# Patient Record
Sex: Male | Born: 1990
Health system: Southern US, Community
[De-identification: ages and names within clinical notes are randomized; demographics above are authoritative.]

## PROBLEM LIST (undated history)

## (undated) DIAGNOSIS — F419 Anxiety disorder, unspecified: Secondary | ICD-10-CM

## (undated) DIAGNOSIS — F32A Depression, unspecified: Secondary | ICD-10-CM

## (undated) DIAGNOSIS — Z87820 Personal history of traumatic brain injury: Secondary | ICD-10-CM

## (undated) HISTORY — PX: NO PAST SURGERIES: SHX2092

## (undated) HISTORY — DX: Personal history of traumatic brain injury: Z87.820

## (undated) HISTORY — DX: Depression, unspecified: F32.A

## (undated) HISTORY — DX: Anxiety disorder, unspecified: F41.9

## (undated) HISTORY — PX: DENTAL SURGERY: SHX609

---
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
## 2015-12-18 ENCOUNTER — Other Ambulatory Visit: Payer: Self-pay | Admitting: Orthopedic Surgery

## 2015-12-18 DIAGNOSIS — M25562 Pain in left knee: Secondary | ICD-10-CM

## 2016-01-11 ENCOUNTER — Encounter (INDEPENDENT_AMBULATORY_CARE_PROVIDER_SITE_OTHER): Payer: Self-pay | Admitting: *Deleted

## 2016-04-17 ENCOUNTER — Encounter (HOSPITAL_COMMUNITY): Payer: Self-pay | Admitting: Emergency Medicine

## 2016-04-17 ENCOUNTER — Ambulatory Visit (HOSPITAL_COMMUNITY)
Admission: EM | Admit: 2016-04-17 | Discharge: 2016-04-17 | Disposition: A | Discharge status: Home / Self Care | Payer: 59 | Attending: Emergency Medicine | Admitting: Emergency Medicine

## 2016-04-17 DIAGNOSIS — B9789 Other viral agents as the cause of diseases classified elsewhere: Secondary | ICD-10-CM

## 2016-04-17 DIAGNOSIS — J069 Acute upper respiratory infection, unspecified: Secondary | ICD-10-CM | POA: Diagnosis not present

## 2016-04-17 MED ORDER — BENZONATATE 100 MG PO CAPS: 100.0000 mg | ORAL_CAPSULE | Freq: Three times a day (TID) | ORAL | 0 refills | Status: DC

## 2016-04-17 MED ORDER — IPRATROPIUM BROMIDE 0.06 % NA SOLN: 2.0000 | Freq: Four times a day (QID) | NASAL | 12 refills | Status: DC

## 2016-04-17 NOTE — ED Triage Notes (Signed)
The patient [facility] with a complaint of a cough and sore throat with body aches that started 3 days ago.

## 2016-04-17 NOTE — Discharge Instructions (Signed)
You most likely have a viral URI, I advise rest, plenty of fluids and management of symptoms with over the counter medicines. For symptoms you may take Tylenol as needed every 4-6 hours for body aches or fever, not to exceed 4,000 mg a day, Take mucinex or mucinex DM ever 12 hours with a full glass of water, you may use an inhaled steroid such as Flonase, 2 sprays each nostril once a day for congestion, or an antihistamine such as Claritin or Zyrtec once a day. In addition to this I have prescribed Tessalon, take 1 tablet 3 times a day as needed for cough and Ipratropium nasal spray, 2 sprays each nostril 4 times a day for congestion.  Should your symptoms worsen or fail to resolve, follow up with your primary care provider or return to clinic.

## 2016-04-17 NOTE — ED Provider Notes (Signed)
CSN: [id number]     Arrival date & time 04/17/16  1632 History   First MD Initiated Contact with Patient 04/17/16 1752     Chief Complaint  Patient presents with  . Cough   (Consider location/radiation/quality/duration/timing/severity/associated sxs/prior Treatment) 26 year old male with three day history of cough, congestion, fever, and chills. Cough is worse at night, productive with clear to white sputum. No change in appetite or fluid intake, he has not taken any OTC medicines.    Cough  Associated symptoms: chills, fever and rhinorrhea   Associated symptoms: no ear pain, no myalgias, no shortness of breath, no sore throat and no wheezing     History reviewed. No pertinent past medical history. History reviewed. No pertinent surgical history. History reviewed. No pertinent family history. Social History  Substance Use Topics  . Smoking status: Never Smoker  . Smokeless tobacco: Never Used  . Alcohol use Yes     Comment: weekly    Review of Systems  Constitutional: Positive for chills, fatigue and fever. Negative for appetite change.  HENT: Positive for congestion and rhinorrhea. Negative for ear discharge, ear pain, sinus pain, sinus pressure and sore throat.   Eyes: Negative.   Respiratory: Positive for cough. Negative for choking, shortness of breath and wheezing.   Cardiovascular: Negative.   Gastrointestinal: Negative for abdominal pain, diarrhea, nausea and vomiting.  Musculoskeletal: Negative.  Negative for arthralgias, back pain, myalgias, neck pain and neck stiffness.  Skin: Negative.   Neurological: Negative for dizziness, syncope, weakness and light-headedness.  All other systems reviewed and are negative.   Allergies  Patient has no known allergies.  Home Medications   Prior to Admission medications   Medication Sig Start Date End Date Taking? Authorizing Provider  benzonatate (TESSALON) 100 MG capsule Take 1 capsule (100 mg total) by mouth every 8  (eight) hours. 04/17/16   Dorena BodoLawrence Meridian Scherger, NP  ipratropium (ATROVENT) 0.06 % nasal spray Place 2 sprays into both nostrils 4 (four) times daily. 04/17/16   Dorena BodoLawrence Dartanyon Frankowski, NP   Meds Ordered and Administered this Visit  Medications - No data to display  BP 117/76 (BP Location: Right Arm)   Pulse 87   Temp 98.6 F (37 C) (Oral)   Resp 16   SpO2 97%  No data found.   Physical Exam  Constitutional: He is oriented to person, place, and time. He appears well-developed and well-nourished. No distress.  HENT:  Right Ear: Tympanic membrane and external ear normal.  Left Ear: Tympanic membrane and external ear normal.  Nose: Nose normal. Right sinus exhibits no maxillary sinus tenderness and no frontal sinus tenderness. Left sinus exhibits no maxillary sinus tenderness and no frontal sinus tenderness.  Mouth/Throat: Uvula is midline and oropharynx is clear and moist. No oropharyngeal exudate.  Eyes: Pupils are equal, round, and reactive to light.  Neck: Normal range of motion. Neck supple. No JVD present.  Cardiovascular: Normal rate and regular rhythm.   Pulmonary/Chest: Effort normal and breath sounds normal. No respiratory distress. He has no wheezes.  Abdominal: Soft. Bowel sounds are normal.  Lymphadenopathy:    He has no cervical adenopathy.  Neurological: He is alert and oriented to person, place, and time.  Skin: Skin is warm and dry. Capillary refill takes less than 2 seconds. No rash noted. He is not diaphoretic. No erythema.  Psychiatric: He has a normal mood and affect.  Nursing note and vitals reviewed.   Urgent Care Course   Clinical Course  Procedures (including critical care time)  Labs Review Labs Reviewed - No data to display  Imaging Review No results found.   Visual Acuity Review  Right Eye Distance:   Left Eye Distance:   Bilateral Distance:    Right Eye Near:   Left Eye Near:    Bilateral Near:         MDM   1. Viral URI with cough    You most likely have a viral URI, I advise rest, plenty of fluids and management of symptoms with over the counter medicines. For symptoms you may take Tylenol as needed every 4-6 hours for body aches or fever, not to exceed 4,000 mg a day, Take mucinex or mucinex DM ever 12 hours with a full glass of water, you may use an inhaled steroid such as Flonase, 2 sprays each nostril once a day for congestion, or an antihistamine such as Claritin or Zyrtec once a day. In addition to this I have prescribed Tessalon, take 1 tablet 3 times a day as needed for cough and Ipratropium nasal spray, 2 sprays each nostril 4 times a day for congestion.  Should your symptoms worsen or fail to resolve, follow up with your primary care provider or return to clinic.      Dorena Bodo, NP 04/17/16 1805

## 2018-05-10 ENCOUNTER — Encounter (INDEPENDENT_AMBULATORY_CARE_PROVIDER_SITE_OTHER): Payer: Self-pay

## 2018-05-10 ENCOUNTER — Encounter: Payer: Self-pay | Admitting: Family Medicine

## 2018-05-10 ENCOUNTER — Ambulatory Visit: Payer: BLUE CROSS/BLUE SHIELD | Admitting: Family Medicine

## 2018-05-10 VITALS — BP 134/62 | HR 82 | Temp 98.9°F | Ht 66.5 in | Wt 120.0 lb

## 2018-05-10 DIAGNOSIS — F410 Panic disorder [episodic paroxysmal anxiety] without agoraphobia: Secondary | ICD-10-CM

## 2018-05-10 DIAGNOSIS — Z23 Encounter for immunization: Secondary | ICD-10-CM | POA: Diagnosis not present

## 2018-05-10 MED ORDER — HYDROXYZINE PAMOATE 50 MG PO CAPS: 50.0000 mg | ORAL_CAPSULE | Freq: Three times a day (TID) | ORAL | 0 refills | Status: DC | PRN

## 2018-05-10 NOTE — Progress Notes (Signed)
Subjective:     Brent Faulkner is a 28 y.o. male presenting for Establish Care (previous PCP Dr Clarene Duke at [facility]) and Panic Attack (3 weeks ago had 2 episodes of panic attacks. Symptoms were sweaty, heart racing, ears ringing, feeling like world is crashing down or so. lasted 20 minutes. Have been fine since then.)     Anxiety  Presents for initial visit. Onset was 1 to 4 weeks ago. The problem has been waxing and waning. Symptoms include chest pain, irritability, nervous/anxious behavior, palpitations and panic. Patient reports no decreased concentration, depressed mood, hyperventilation, insomnia or shortness of breath. Symptoms occur rarely. The most recent episode lasted 20 minutes. The severity of symptoms is severe. The symptoms are aggravated by family issues (grandfather diagnosed with stage 4 lung cancer). The quality of sleep is good.   (Concussion) Past treatments include nothing.   No skipped  Exercises regularly w/o cp or sob, or palpitations  Review of Systems  Constitutional: Positive for diaphoresis and irritability.  HENT:       Ears ringing  Respiratory: Negative for shortness of breath.   Cardiovascular: Positive for chest pain and palpitations.  Psychiatric/Behavioral: Negative for decreased concentration. The patient is nervous/anxious. The patient does not have insomnia.      Social History   Tobacco Use  Smoking Status Former Smoker  . Packs/day: 0.25  . Years: 4.00  . Pack years: 1.00  . Types: Cigarettes  . Last attempt to quit: 04/05/2015  . Years since quitting: 3.0  Smokeless Tobacco Never Used        Objective:    BP Readings from Last 3 Encounters:  05/10/18 134/62  04/17/16 117/76   Wt Readings from Last 3 Encounters:  05/10/18 120 lb (54.4 kg)    BP 134/62   Pulse 82   Temp 98.9 F (37.2 C)   Ht 5' 6.5" (1.689 m)   Wt 120 lb (54.4 kg)   SpO2 98%   BMI 19.08 kg/m    Physical Exam Constitutional:    Appearance: Normal appearance. He is not ill-appearing or diaphoretic.  HENT:     Right Ear: External ear normal.     Left Ear: External ear normal.     Nose: Nose normal.  Eyes:     General: No scleral icterus.    Extraocular Movements: Extraocular movements intact.     Conjunctiva/sclera: Conjunctivae normal.  Neck:     Musculoskeletal: Neck supple.  Cardiovascular:     Rate and Rhythm: Normal rate and regular rhythm.     Heart sounds: No murmur.  Pulmonary:     Effort: Pulmonary effort is normal. No respiratory distress.     Breath sounds: No wheezing or rales.  Skin:    General: Skin is warm and dry.  Neurological:     Mental Status: He is alert. Mental status is at baseline.  Psychiatric:        Mood and Affect: Mood normal.    Depression screen PHQ 2/9 05/10/2018  Decreased Interest 1  Down, Depressed, Hopeless 1  PHQ - 2 Score 2  Altered sleeping 0  Tired, decreased energy 1  Change in appetite 0  Feeling bad or failure about yourself  1  Trouble concentrating 0  Moving slowly or fidgety/restless 0  Suicidal thoughts 0  PHQ-9 Score 4  Difficult doing work/chores Somewhat difficult   GAD 7 : Generalized Anxiety Score 05/10/2018  Nervous, Anxious, on Edge 1  Control/stop worrying 0  Worry too  much - different things 1  Trouble relaxing 0  Restless 0  Easily annoyed or irritable 0  Afraid - awful might happen 0  Total GAD 7 Score 2  Anxiety Difficulty Somewhat difficult           Assessment & Plan:   Problem List Items Addressed This Visit    None    Visit Diagnoses    Panic attack    -  Primary   Relevant Medications   hydrOXYzine (VISTARIL) 50 MG capsule     Symptoms most consistent with panic attack as not having similar symptoms with exercise routine. Though PHQ and GAD relatively low. Does not seem very chronic at this time. Watch and wait and trial of hydroxyzine if symptoms return.   Return sooner if symptoms persisting   Return in about 1  year (around 05/11/2019).  [name], MD

## 2018-05-10 NOTE — Patient Instructions (Addendum)
#  Panic Attacks - try the hydroxyzine if you get symptoms again - this is to take as needed - if you are getting symptoms more often, return to clinic  Otherwise come back in 1 year or sooner for visits as needed

## 2018-11-13 ENCOUNTER — Telehealth: Payer: Self-pay

## 2018-11-13 NOTE — Telephone Encounter (Signed)
I will see him then

## 2018-11-13 NOTE — Telephone Encounter (Signed)
For 2 wks pt has achy lt shoulder pain that radiates down upper lt arm and into lt upper chest. Pain originates in lt shoulder. No jaw or neck pain. No SOB,H/A, dizziness no N&V and no weakness in extremities.when pt gets out of car Lt shoulder hurts worse; when pt moves lt shoulder hurts worse. Pain level is 5 - 6 when at its most intense pain. Pt has no covid symptoms, no travel and no known exposure to + covid.pt does not seem in distress. Pt requested appt on 11/16/18 in afternoon since pt is off work then. Offered pt sooner appt but pt declined. ED precautions given and pt voiced understanding. Pt has in office appt on 11/16/18 at 2 PM with Dr Glori Bickers.

## 2018-11-16 ENCOUNTER — Encounter: Payer: Self-pay | Admitting: Family Medicine

## 2018-11-16 ENCOUNTER — Other Ambulatory Visit: Payer: Self-pay

## 2018-11-16 ENCOUNTER — Ambulatory Visit (INDEPENDENT_AMBULATORY_CARE_PROVIDER_SITE_OTHER): Payer: BC Managed Care – PPO | Admitting: Family Medicine

## 2018-11-16 DIAGNOSIS — M25512 Pain in left shoulder: Secondary | ICD-10-CM

## 2018-11-16 MED ORDER — MELOXICAM 15 MG PO TABS: 15.0000 mg | ORAL_TABLET | Freq: Every day | ORAL | 1 refills | Status: DC

## 2018-11-16 NOTE — Assessment & Plan Note (Signed)
Suspect tendonitis (possible biceps) from overuse  Likely from repetitive overhead pushing with LUE 20 or more times per day at work (also some push ups)  Disc use of ice Will change arms for above activity /avoid when able  meloxicam 15 mg daily with food for 30 days/then prn (watch for GI side eff)  Given handout re: passive rom shoulder exercises as well to do daily  If not starting to improve in 2 wk -will plan appt with sport med

## 2018-11-16 NOTE — Patient Instructions (Signed)
Switch arms for reped lifting/overhead activity   Take meloxicam with a meal daily for about a month  Then use as needed  Avoid positions that hurt   Use ice for 10 minutes whenever you can   If not starting to improve in 2 weeks call so we can schedule a sport med visit with Dr Lorelei Pont

## 2018-11-16 NOTE — Progress Notes (Signed)
Subjective:    Patient ID: Brent Faulkner, male    DOB: October 01, 1990, 28 y.o.   MRN: [id number]  HPI Here for L shoulder pain /also arm   2 weeks -dull pain on top of shoulder  Radiates to back of shoulder/scapula  Goes down arm occ - will tingle under arm sometimes   Hurts to close car door He drives with L arm on top and palm down  Also when pushing up a vertical door at work  (over 20-30 times per day)  Is R handed   He has tried ice /helped for a little while  Tried ibuprofen -it does help briefly (2 pills)   Patient Active Problem List   Diagnosis Date Noted  . Shoulder pain, left 11/16/2018   Past Medical History:  Diagnosis Date  . History of multiple concussions    Past Surgical History:  Procedure Laterality Date  . NO PAST SURGERIES     Social History   Tobacco Use  . Smoking status: Former Smoker    Packs/day: 0.25    Years: 4.00    Pack years: 1.00    Types: Cigarettes    Quit date: 04/05/2015    Years since quitting: 3.6  . Smokeless tobacco: Never Used  Substance Use Topics  . Alcohol use: Yes    Comment: 4 beers a week, 2 beers in a setting  . Drug use: No   Family History  Problem Relation Age of Onset  . Addison's disease Mother   . Healthy Father   . Healthy Sister   . Diabetes Brother        Type 1  . Breast cancer Maternal Grandmother   . Heart attack Maternal Grandfather 66  . Lung cancer Paternal Grandfather        tobacco use   No Known Allergies Current Outpatient Medications on File Prior to Visit  Medication Sig Dispense Refill  . hydrOXYzine (VISTARIL) 50 MG capsule Take 1-2 capsules (50-100 mg total) by mouth 3 (three) times daily as needed for anxiety. 30 capsule 0  . Multiple Vitamins-Minerals (AIRBORNE PO) Take by mouth daily.     No current facility-administered medications on file prior to visit.     Review of Systems  Constitutional: Negative for activity change, appetite change, fatigue, fever and unexpected weight  change.  HENT: Negative for congestion, rhinorrhea, sore throat and trouble swallowing.   Eyes: Negative for pain, redness, itching and visual disturbance.  Respiratory: Negative for cough, chest tightness, shortness of breath and wheezing.   Cardiovascular: Negative for chest pain and palpitations.  Gastrointestinal: Negative for abdominal pain, blood in stool, constipation, diarrhea and nausea.  Endocrine: Negative for cold intolerance, heat intolerance, polydipsia and polyuria.  Genitourinary: Negative for difficulty urinating, dysuria, frequency and urgency.  Musculoskeletal: Negative for arthralgias, joint swelling and myalgias.       L shoulder and arm pain   Tingling in L arm occ  Skin: Negative for pallor and rash.  Neurological: Negative for dizziness, tremors, weakness, numbness and headaches.  Hematological: Negative for adenopathy. Does not bruise/bleed easily.  Psychiatric/Behavioral: Negative for decreased concentration and dysphoric mood. The patient is not nervous/anxious.        Objective:   Physical Exam Constitutional:      Appearance: Normal appearance. He is normal weight. He is not ill-appearing or diaphoretic.  HENT:     Head: Atraumatic.  Eyes:     Extraocular Movements: Extraocular movements intact.     Conjunctiva/sclera: Conjunctivae  normal.     Pupils: Pupils are equal, round, and reactive to light.  Cardiovascular:     Rate and Rhythm: Normal rate and regular rhythm.  Pulmonary:     Effort: Pulmonary effort is normal. No respiratory distress.     Breath sounds: Normal breath sounds. No wheezing.     Comments: Good air exch  No chest wall tenderness Musculoskeletal:        General: Tenderness present. No swelling, deformity or signs of injury.     Comments: Shoulder  No deformity/swelling/warmth or erythema  No crepitus  No obvious effusion  Abduction - full but pain past 90 def Hawking test -pos  Neer test -pos for posterior shoulder  pain Internal rotation -normal External rotation -normal Tenderness -over acromion (mild), biceps tendon (moreso)  Normal grip and hand dexterity Nl sens to lt touch and temp in hands/arms today    Skin:    General: Skin is warm and dry.     Coloration: Skin is not pale.     Findings: No erythema or rash.  Neurological:     Mental Status: He is alert.     Sensory: No sensory deficit.     Motor: No weakness.     Coordination: Coordination normal.     Deep Tendon Reflexes: Reflexes normal.  Psychiatric:        Mood and Affect: Mood normal.           Assessment & Plan:   Problem List Items Addressed This Visit      Other   Shoulder pain, left    Suspect tendonitis (possible biceps) from overuse  Likely from repetitive overhead pushing with LUE 20 or more times per day at work (also some push ups)  Disc use of ice Will change arms for above activity /avoid when able  meloxicam 15 mg daily with food for 30 days/then prn (watch for GI side eff)  Given handout re: passive rom shoulder exercises as well to do daily  If not starting to improve in 2 wk -will plan appt with sport med

## 2019-01-06 NOTE — Progress Notes (Signed)
Brent Sandler T. Gray Doering, Brent Faulkner [facility] at [facility] [address] [place], [address] Phone: [phone]  FAX: [phone]  Brent Faulkner - 28 y.o. male  MRN [id number]  Date of Birth: 08-May-1990  Visit Date: 01/07/2019  PCP: [name], Brent Faulkner  Referred by: [name], Brent Faulkner  Chief Complaint  Patient presents with  . Shoulder Pain    Left-Dull achy pain since July   Subjective:   Brent Faulkner is a 28 y.o. very pleasant male patient with Body mass index is 20.51 kg/m. who presents with the following:  He is a very nice young man and he presents with some ongoing left shoulder pain.  He saw my partner Dr. Milinda Antis about 2 months ago.  At that point she felt it was likely tendinopathy and overuse.  She gave him some meloxicam.  He is here today in follow-up on these issues.  Dull achy pain an dup aup and down and up at the top.  Don all the way and his upper L UE. Lat and down the side of pec.    He is someone who works out quite a bit.  He does do some upper body exercises including pectoralis workouts, shoulder as well as back workouts.  He does some having some tingling on the left side down his arm, he also has pain with abduction as well as internal range of motion.  No significant shoulder history.  No significant back or neck injury.  Tingling on the L arm - bottom to.   Impingement L Ac pain Biceps tendinopathy L cervical rad  Past Medical History, Surgical History, Social History, Family History, Problem List, Medications, and Allergies have been reviewed and updated if relevant.  Patient Active Problem List   Diagnosis Date Noted  . Shoulder pain, left 11/16/2018    Past Medical History:  Diagnosis Date  . History of multiple concussions     Past Surgical History:  Procedure Laterality Date  . NO PAST SURGERIES      Social History   Socioeconomic History  . Marital status: Single     Spouse name: Not on file  . Number of children: Not on file  . Years of education: Scientist, water quality  . Highest education level: Not on file  Occupational History  . Not on file  Social Needs  . Financial resource strain: Not hard at all  . Food insecurity    Worry: Not on file    Inability: Not on file  . Transportation needs    Medical: Not on file    Non-medical: Not on file  Tobacco Use  . Smoking status: Former Smoker    Packs/day: 0.25    Years: 4.00    Pack years: 1.00    Types: Cigarettes    Quit date: 04/05/2015    Years since quitting: 3.7  . Smokeless tobacco: Never Used  Substance and Sexual Activity  . Alcohol use: Yes    Comment: 4 beers a week, 2 beers in a setting  . Drug use: No  . Sexual activity: Yes    Birth control/protection: Condom  Lifestyle  . Physical activity    Days per week: Not on file    Minutes per session: Not on file  . Stress: Not on file  Relationships  . Social Musician on phone: Not on file    Gets together: Not on file  Attends religious service: Not on file    Active member of club or organization: Not on file    Attends meetings of clubs or organizations: Not on file    Relationship status: Not on file  . Intimate partner violence    Fear of current or ex partner: Not on file    Emotionally abused: Not on file    Physically abused: Not on file    Forced sexual activity: Not on file  Other Topics Concern  . Not on file  Social History Narrative   Lives with best friend (roommate)   Work: [employer]   Enjoys: couching baseball, attending athletic events, movies, social   Exercise: bike x 1 hour, and HIIT working out daily   Diet: better now - cut caffeine out, tries to eat veggies, and meat    Family History  Problem Relation Age of Onset  . Addison's disease Mother   . Healthy Father   . Healthy Sister   . Diabetes Brother        Type 1  . Breast cancer Maternal Grandmother    . Heart attack Maternal Grandfather 66  . Lung cancer Paternal Grandfather        tobacco use    No Known Allergies  Medication list reviewed and updated in full in [facility].  GEN: No fevers, chills. Nontoxic. Primarily MSK c/o today. MSK: Detailed in the HPI GI: tolerating PO intake without difficulty Neuro: No numbness, parasthesias, or tingling associated. Otherwise the pertinent positives of the ROS are noted above.   Objective:   BP 110/80   Pulse 78   Temp 98.7 F (37.1 C) (Temporal)   Ht 5' 6.5" (1.689 m)   Wt 129 lb (58.5 kg)   SpO2 98%   BMI 20.51 kg/m    GEN: Well-developed,well-nourished,in no acute distress; alert,appropriate and cooperative throughout examination HEENT: Normocephalic and atraumatic without obvious abnormalities. Ears, externally no deformities PULM: Breathing comfortably in no respiratory distress EXT: No clubbing, cyanosis, or edema PSYCH: Normally interactive. Cooperative during the interview. Pleasant. Friendly and conversant. Not anxious or depressed appearing. Normal, full affect.  Shoulder: LEFT Inspection: No muscle wasting or winging Ecchymosis/edema: neg  AC joint, scapula, clavicle: ttp at ac joint Cervical spine: NT, full ROM Spurling's: neg Abduction: full, 5/5 Flexion: full, 5/5 IR, full, lift-off: 5/5 ER at neutral: full, 5/5 AC crossover: pos Neer: pos Hawkins: pos Drop Test: neg Empty Can: pos Supraspinatus insertion: mild-mod T Bicipital groove: NT Speed's: neg Yergason's: neg Sulcus sign: neg Scapular dyskinesis: none C5-T1 intact  Neuro: Sensation intact Grip 5/5   Radiology: No results found.  Assessment and Plan:     ICD-10-CM   1. Acute pain of left shoulder  M25.512   2. Impingement syndrome of left shoulder  M75.42   3. Biceps tendinopathy, left  M67.922   4. Pain in left acromioclavicular joint  M25.512   5. Cervical radiculopathy, acute  M54.12    Multifactorial shoulder pain with  some additional cervical radiculopathy all at a young age of 28 years old.  Alteration of lifting protocols, and I would have him do some home rehab with a pulse of steroids that will hopefully help with all of this.  Follow-up: Return in about 1 month (around 02/07/2019).  Meds ordered this encounter  Medications  . predniSONE (DELTASONE) 20 MG tablet    Sig: 2 tabs po daily for 5 days, then 1 tab po daily for 5  days    Dispense:  15 tablet    Refill:  0   No orders of the defined types were placed in this encounter.   Signed,  Maud Deed. Josian Lanese, Brent Faulkner   Outpatient Encounter Medications as of 01/07/2019  Medication Sig  . hydrOXYzine (VISTARIL) 50 MG capsule Take 1-2 capsules (50-100 mg total) by mouth 3 (three) times daily as needed for anxiety.  . meloxicam (MOBIC) 15 MG tablet Take 1 tablet (15 mg total) by mouth daily. With food  . Multiple Vitamins-Minerals (AIRBORNE PO) Take by mouth daily.  . predniSONE (DELTASONE) 20 MG tablet 2 tabs po daily for 5 days, then 1 tab po daily for 5 days   No facility-administered encounter medications on file as of 01/07/2019.

## 2019-01-07 ENCOUNTER — Encounter: Payer: Self-pay | Admitting: Family Medicine

## 2019-01-07 ENCOUNTER — Other Ambulatory Visit: Payer: Self-pay

## 2019-01-07 ENCOUNTER — Ambulatory Visit: Payer: BC Managed Care – PPO | Admitting: Family Medicine

## 2019-01-07 VITALS — BP 110/80 | HR 78 | Temp 98.7°F | Ht 66.5 in | Wt 129.0 lb

## 2019-01-07 DIAGNOSIS — M7542 Impingement syndrome of left shoulder: Secondary | ICD-10-CM

## 2019-01-07 DIAGNOSIS — M5412 Radiculopathy, cervical region: Secondary | ICD-10-CM

## 2019-01-07 DIAGNOSIS — M67922 Unspecified disorder of synovium and tendon, left upper arm: Secondary | ICD-10-CM | POA: Diagnosis not present

## 2019-01-07 DIAGNOSIS — M25512 Pain in left shoulder: Secondary | ICD-10-CM

## 2019-01-07 MED ORDER — PREDNISONE 20 MG PO TABS: ORAL_TABLET | ORAL | 0 refills | Status: DC

## 2019-03-18 ENCOUNTER — Encounter: Payer: Self-pay | Admitting: Family Medicine

## 2019-03-18 ENCOUNTER — Ambulatory Visit (INDEPENDENT_AMBULATORY_CARE_PROVIDER_SITE_OTHER): Payer: BC Managed Care – PPO | Admitting: Family Medicine

## 2019-03-18 VITALS — BP 114/71 | HR 82 | Temp 98.4°F

## 2019-03-18 DIAGNOSIS — F320 Major depressive disorder, single episode, mild: Secondary | ICD-10-CM

## 2019-03-18 DIAGNOSIS — F410 Panic disorder [episodic paroxysmal anxiety] without agoraphobia: Secondary | ICD-10-CM | POA: Diagnosis not present

## 2019-03-18 DIAGNOSIS — F411 Generalized anxiety disorder: Secondary | ICD-10-CM | POA: Diagnosis not present

## 2019-03-18 DIAGNOSIS — F329 Major depressive disorder, single episode, unspecified: Secondary | ICD-10-CM | POA: Insufficient documentation

## 2019-03-18 MED ORDER — SERTRALINE HCL 50 MG PO TABS: ORAL_TABLET | ORAL | 1 refills | Status: DC

## 2019-03-18 MED ORDER — HYDROXYZINE PAMOATE 50 MG PO CAPS: 50.0000 mg | ORAL_CAPSULE | Freq: Three times a day (TID) | ORAL | 0 refills | Status: DC | PRN

## 2019-03-18 NOTE — Progress Notes (Signed)
I connected with Brent Faulkner on 03/18/19 at  4:20 PM EST by video and verified that I am speaking with the correct person using two identifiers.   I discussed the limitations, risks, security and privacy concerns of performing an evaluation and management service by video and the availability of in person appointments. I also discussed with the patient that there may be a patient responsible charge related to this service. The patient expressed understanding and agreed to proceed.  Patient location: Home Provider Location: [facility] Participants: Lesleigh Noe and Brent Faulkner   Subjective:     Brent Faulkner is a 28 y.o. male presenting for Anxiety (getting worse. )     Anxiety Presents for follow-up visit. Symptoms include depressed mood (only when worrying), excessive worry, irritability, malaise, nervous/anxious behavior, panic (rare) and restlessness. Patient reports no chest pain, insomnia or palpitations. Symptoms occur most days. The severity of symptoms is moderate.    Taking hydroxyzine which calms him. But makes him very drowsy - only takes it is night  Last week every day but Friday - 5/7 days  Last few months - normally having bad days 1 time per week  Turning down social engagement Regular exercise Will do deep breathing   Review of Systems  Constitutional: Positive for irritability.  Cardiovascular: Negative for chest pain and palpitations.  Psychiatric/Behavioral: The patient is nervous/anxious. The patient does not have insomnia.      Social History   Tobacco Use  Smoking Status Former Smoker  . Packs/day: 0.25  . Years: 4.00  . Pack years: 1.00  . Types: Cigarettes  . Quit date: 04/05/2015  . Years since quitting: 3.9  Smokeless Tobacco Never Used        Objective:   BP Readings from Last 3 Encounters:  03/18/19 114/71  01/07/19 110/80  11/16/18 102/60   Wt Readings from Last 3 Encounters:  01/07/19 129 lb (58.5 kg)   11/16/18 125 lb (56.7 kg)  05/10/18 120 lb (54.4 kg)   BP 114/71 Comment: per patient  Pulse 82 Comment: per patient  Temp 98.4 F (36.9 C) Comment: per patient   Physical Exam Constitutional:      Appearance: Normal appearance. He is not ill-appearing.  HENT:     Head: Normocephalic and atraumatic.     Right Ear: External ear normal.     Left Ear: External ear normal.  Eyes:     Conjunctiva/sclera: Conjunctivae normal.  Pulmonary:     Effort: Pulmonary effort is normal. No respiratory distress.  Neurological:     Mental Status: He is alert. Mental status is at baseline.  Psychiatric:        Mood and Affect: Mood normal.        Behavior: Behavior normal.        Thought Content: Thought content normal.        Judgment: Judgment normal.    Depression screen [facility] 2/9 03/18/2019 05/10/2018  Decreased Interest 1 1  Down, Depressed, Hopeless 1 1  PHQ - 2 Score 2 2  Altered sleeping 0 0  Tired, decreased energy 2 1  Change in appetite 0 0  Feeling bad or failure about yourself  1 1  Trouble concentrating 2 0  Moving slowly or fidgety/restless 1 0  Suicidal thoughts 0 0  PHQ-9 Score 8 4  Difficult doing work/chores Very difficult Somewhat difficult   GAD 7 : Generalized Anxiety Score 03/18/2019 05/10/2018  Nervous, Anxious, on Edge 2 1  Control/stop worrying 2 0  Worry too much - different things 2 1  Trouble relaxing 1 0  Restless 1 0  Easily annoyed or irritable 2 0  Afraid - awful might happen 1 0  Total GAD 7 Score 11 2  Anxiety Difficulty Somewhat difficult Somewhat difficult              Assessment & Plan:   Problem List Items Addressed This Visit      Other   Generalized anxiety disorder - Primary    Worsening anxiety which is leading to depression and avoiding things he once enjoyed. Discussed options of propranolol, therapy, or SSRI. Given increased frequency of symptoms elected to start SSRI. Discussed risk/side effects. Return in 6 weeks       Relevant Medications   sertraline (ZOLOFT) 50 MG tablet   hydrOXYzine (VISTARIL) 50 MG capsule   Major depressive disorder   Relevant Medications   sertraline (ZOLOFT) 50 MG tablet   hydrOXYzine (VISTARIL) 50 MG capsule    Other Visit Diagnoses    Panic attack       Relevant Medications   sertraline (ZOLOFT) 50 MG tablet   hydrOXYzine (VISTARIL) 50 MG capsule       Return in about 6 weeks (around 04/29/2019).  Brent Child, MD

## 2019-03-18 NOTE — Patient Instructions (Addendum)
You are going to start a new antidepressant medication.   One of the risks of this medication is increase in suicidal thoughts.   Your suicide Action plan is as follows:  1) Call friends or family 2) Call the Suicide Hotline [phone] which is available 24 hours 3) Call the Clinic   The most common side effect is stomach upset. If this happens it means the medication is working. It should get better in 1-3 weeks.   Medication for depression and anxiety often takes 6-8 weeks to have a noticeable difference so stick with it. Also the best way for recovery is taking medication and seeing a therapist -- this is so important.    How to help anxiety and depression  1) Regular Exercise - walking, jogging, cycling, dancing, strength training - aiming for 150 minutes of exercise a week --> Yoga has been shown in research to reduce depression and anxiety -- with even just one hour long session per week  2)  Begin a Mindfulness/Meditation practice -- this can take a little as 3 minutes and is helpful for all kinds of mood issues -- You can find resources in books -- Or you can download apps like  ---- Headspace App  ---- Calm  ---- Insignt Timer ---- Stop, Breathe & Think  # With each of these Apps - you should decline the "start free trial" offer and as you search through the App should be able to access some of their free content. You can also chose to pay for the content if you find one that works well for you.   # Many of them also offer sleep specific content which may help with insomnia  3) Healthy Diet -- Avoid or decrease Caffeine -- Avoid or decrease Alcohol -- Drink plenty of water, have a balanced diet -- Avoid cigarettes and marijuana (as well as other recreational drugs)  4) Find a therapist  -- [facility] is one option. Call [phone] -- Or you can check out www.psychologytoday.com -- you can read bios of therapists and see if they accept  insurance -- Check with your insurance to see if you have coverage and who may take your insurance -- www.psychologytoday.com

## 2019-03-18 NOTE — Assessment & Plan Note (Signed)
Worsening anxiety which is leading to depression and avoiding things he once enjoyed. Discussed options of propranolol, therapy, or SSRI. Given increased frequency of symptoms elected to start SSRI. Discussed risk/side effects. Return in 6 weeks

## 2019-03-22 ENCOUNTER — Encounter: Payer: Self-pay | Admitting: Family Medicine

## 2019-03-27 DIAGNOSIS — Z03818 Encounter for observation for suspected exposure to other biological agents ruled out: Secondary | ICD-10-CM | POA: Diagnosis not present

## 2019-04-16 ENCOUNTER — Encounter: Payer: Self-pay | Admitting: Family Medicine

## 2019-04-16 NOTE — Telephone Encounter (Signed)
FY to PCP. I responded to the patient

## 2019-05-27 ENCOUNTER — Encounter: Payer: Self-pay | Admitting: Family Medicine

## 2019-05-27 ENCOUNTER — Ambulatory Visit (INDEPENDENT_AMBULATORY_CARE_PROVIDER_SITE_OTHER): Payer: BC Managed Care – PPO | Admitting: Family Medicine

## 2019-05-27 VITALS — Wt 135.0 lb

## 2019-05-27 DIAGNOSIS — F410 Panic disorder [episodic paroxysmal anxiety] without agoraphobia: Secondary | ICD-10-CM

## 2019-05-27 DIAGNOSIS — F320 Major depressive disorder, single episode, mild: Secondary | ICD-10-CM

## 2019-05-27 DIAGNOSIS — F411 Generalized anxiety disorder: Secondary | ICD-10-CM | POA: Diagnosis not present

## 2019-05-27 MED ORDER — HYDROXYZINE PAMOATE 50 MG PO CAPS: 50.0000 mg | ORAL_CAPSULE | Freq: Three times a day (TID) | ORAL | 0 refills | Status: DC | PRN

## 2019-05-27 MED ORDER — SERTRALINE HCL 50 MG PO TABS: 50.0000 mg | ORAL_TABLET | Freq: Every day | ORAL | 3 refills | Status: DC

## 2019-05-27 NOTE — Assessment & Plan Note (Signed)
Pt notes significant improvement. Feeling great. Discussed continuing x 1 year in order to best aide in remission. Return prn.

## 2019-05-27 NOTE — Progress Notes (Signed)
I connected with Brent Faulkner on 05/27/19 at  3:00 PM EST by video and verified that I am speaking with the correct person using two identifiers.   I discussed the limitations, risks, security and privacy concerns of performing an evaluation and management service by video and the availability of in person appointments. I also discussed with the patient that there may be a patient responsible charge related to this service. The patient expressed understanding and agreed to proceed.  Patient location: work Provider Location: [facility] Participants: [name] and Brent Faulkner   Subjective:     Brent Faulkner is a 29 y.o. male presenting for Follow-up (Zoloft )     HPI  #Depression #Anxiety - feeling great - started seeing a big difference - ED has improved significantly as well - tried hydroxyzine a few times but has not needed  Review of Systems  03/18/2019: Clinic - started zoloft and hydroxyzine  Social History   Tobacco Use  Smoking Status Former Smoker  . Packs/day: 0.25  . Years: 4.00  . Pack years: 1.00  . Types: Cigarettes  . Quit date: 04/05/2015  . Years since quitting: 4.1  Smokeless Tobacco Never Used        Objective:   BP Readings from Last 3 Encounters:  03/18/19 114/71  01/07/19 110/80  11/16/18 102/60   Wt Readings from Last 3 Encounters:  05/27/19 135 lb (61.2 kg)  01/07/19 129 lb (58.5 kg)  11/16/18 125 lb (56.7 kg)    Wt 135 lb (61.2 kg)   BMI 21.46 kg/m    Physical Exam Constitutional:      Appearance: Normal appearance. He is not ill-appearing.  HENT:     Head: Normocephalic and atraumatic.     Right Ear: External ear normal.     Left Ear: External ear normal.  Eyes:     Conjunctiva/sclera: Conjunctivae normal.  Pulmonary:     Effort: Pulmonary effort is normal. No respiratory distress.  Neurological:     Mental Status: He is alert. Mental status is at baseline.  Psychiatric:        Mood and  Affect: Mood normal.        Behavior: Behavior normal.        Thought Content: Thought content normal.        Judgment: Judgment normal.    Depression screen [facility] 2/9 03/18/2019 05/10/2018  Decreased Interest 1 1  Down, Depressed, Hopeless 1 1  PHQ - 2 Score 2 2  Altered sleeping 0 0  Tired, decreased energy 2 1  Change in appetite 0 0  Feeling bad or failure about yourself  1 1  Trouble concentrating 2 0  Moving slowly or fidgety/restless 1 0  Suicidal thoughts 0 0  PHQ-9 Score 8 4  Difficult doing work/chores Very difficult Somewhat difficult            Assessment & Plan:   Problem List Items Addressed This Visit      Other   Generalized anxiety disorder   Relevant Medications   sertraline (ZOLOFT) 50 MG tablet   hydrOXYzine (VISTARIL) 50 MG capsule   Major depressive disorder - Primary    Pt notes significant improvement. Feeling great. Discussed continuing x 1 year in order to best aide in remission. Return prn.       Relevant Medications   sertraline (ZOLOFT) 50 MG tablet   hydrOXYzine (VISTARIL) 50 MG capsule    Other Visit Diagnoses  Panic attack       Relevant Medications   sertraline (ZOLOFT) 50 MG tablet   hydrOXYzine (VISTARIL) 50 MG capsule       Return in about 1 year (around 05/26/2020).  Lesleigh Noe, MD

## 2020-02-10 ENCOUNTER — Other Ambulatory Visit: Payer: Self-pay | Admitting: Family Medicine

## 2020-02-10 DIAGNOSIS — F410 Panic disorder [episodic paroxysmal anxiety] without agoraphobia: Secondary | ICD-10-CM

## 2020-02-10 DIAGNOSIS — F411 Generalized anxiety disorder: Secondary | ICD-10-CM

## 2020-02-10 MED ORDER — HYDROXYZINE PAMOATE 50 MG PO CAPS: 50.0000 mg | ORAL_CAPSULE | Freq: Three times a day (TID) | ORAL | 0 refills | Status: DC | PRN

## 2020-03-02 ENCOUNTER — Encounter: Payer: Self-pay | Admitting: Emergency Medicine

## 2020-03-02 ENCOUNTER — Emergency Department
Admission: EM | Admit: 2020-03-02 | Discharge: 2020-03-02 | Disposition: A | Discharge status: Home / Self Care | Payer: BC Managed Care – PPO | Attending: Emergency Medicine | Admitting: Emergency Medicine

## 2020-03-02 ENCOUNTER — Other Ambulatory Visit: Payer: Self-pay

## 2020-03-02 ENCOUNTER — Emergency Department: Payer: BC Managed Care – PPO

## 2020-03-02 ENCOUNTER — Ambulatory Visit
Admission: EM | Admit: 2020-03-02 | Discharge: 2020-03-02 | Disposition: A | Discharge status: Other Acute Inpatient Hospital | Payer: BC Managed Care – PPO

## 2020-03-02 DIAGNOSIS — R109 Unspecified abdominal pain: Secondary | ICD-10-CM | POA: Diagnosis not present

## 2020-03-02 DIAGNOSIS — Z87891 Personal history of nicotine dependence: Secondary | ICD-10-CM | POA: Insufficient documentation

## 2020-03-02 DIAGNOSIS — R1031 Right lower quadrant pain: Secondary | ICD-10-CM | POA: Insufficient documentation

## 2020-03-02 LAB — COMPREHENSIVE METABOLIC PANEL
ALT: 18 U/L (ref 0–44)
AST: 22 U/L (ref 15–41)
Albumin: 5.5 g/dL — ABNORMAL HIGH (ref 3.5–5.0)
Alkaline Phosphatase: 70 U/L (ref 38–126)
Anion gap: 11 (ref 5–15)
BUN: 13 mg/dL (ref 6–20)
CO2: 27 mmol/L (ref 22–32)
Calcium: 10.1 mg/dL (ref 8.9–10.3)
Chloride: 99 mmol/L (ref 98–111)
Creatinine, Ser: 0.89 mg/dL (ref 0.61–1.24)
GFR, Estimated: >60 mL/min (ref >60)
Glucose, Bld: 120 mg/dL — ABNORMAL HIGH (ref 70–99)
Potassium: 4.8 mmol/L (ref 3.5–5.1)
Sodium: 137 mmol/L (ref 135–145)
Total Bilirubin: 0.9 mg/dL (ref 0.3–1.2)
Total Protein: 9.4 g/dL — ABNORMAL HIGH (ref 6.5–8.1)

## 2020-03-02 LAB — CBC
HCT: 50.2 % (ref 39.0–52.0)
Hemoglobin: 17.5 g/dL — ABNORMAL HIGH (ref 13.0–17.0)
MCH: 30.4 pg (ref 26.0–34.0)
MCHC: 34.9 g/dL (ref 30.0–36.0)
MCV: 87.2 fL (ref 80.0–100.0)
Platelets: 292 10*3/uL (ref 150–400)
RBC: 5.76 MIL/uL (ref 4.22–5.81)
RDW: 11.5 % (ref 11.5–15.5)
WBC: 11.1 10*3/uL — ABNORMAL HIGH (ref 4.0–10.5)
nRBC: 0 % (ref 0.0–0.2)

## 2020-03-02 LAB — URINALYSIS, COMPLETE (UACMP) WITH MICROSCOPIC
Bacteria, UA: NONE SEEN
Bilirubin Urine: NEGATIVE
Glucose, UA: NEGATIVE mg/dL
Hgb urine dipstick: NEGATIVE
Ketones, ur: NEGATIVE mg/dL
Leukocytes,Ua: NEGATIVE
Nitrite: NEGATIVE
Protein, ur: NEGATIVE mg/dL
Specific Gravity, Urine: 1.009 (ref 1.005–1.030)
pH: 7 (ref 5.0–8.0)

## 2020-03-02 LAB — LIPASE, BLOOD: Lipase: 25 U/L (ref 11–51)

## 2020-03-02 MED ORDER — NAPROXEN 375 MG PO TABS: 375.0000 mg | ORAL_TABLET | Freq: Two times a day (BID) | ORAL | 0 refills | Status: DC

## 2020-03-02 MED ORDER — ONDANSETRON HCL 4 MG/2ML IJ SOLN
4.0000 mg | Freq: Once | INTRAMUSCULAR | Status: AC
  Administered 2020-03-02: 4 mg via INTRAVENOUS
  Filled 2020-03-02: qty 2

## 2020-03-02 MED ORDER — MORPHINE SULFATE (PF) 4 MG/ML IV SOLN
4.0000 mg | Freq: Once | INTRAVENOUS | Status: AC
  Administered 2020-03-02: 4 mg via INTRAVENOUS
  Filled 2020-03-02: qty 1

## 2020-03-02 MED ORDER — IOHEXOL 300 MG/ML  SOLN
75.0000 mL | Freq: Once | INTRAMUSCULAR | Status: AC | PRN
  Administered 2020-03-02: 75 mL via INTRAVENOUS
  Filled 2020-03-02: qty 75

## 2020-03-02 MED ORDER — CIPROFLOXACIN HCL 500 MG PO TABS: 500.0000 mg | ORAL_TABLET | Freq: Two times a day (BID) | ORAL | 0 refills | Status: AC

## 2020-03-02 NOTE — ED Notes (Signed)
Pt in CT.

## 2020-03-02 NOTE — ED Notes (Signed)
See triage note. Pt presents to the ED for RLQ abdominal pain for the past week. Denies NV but c/o some diarrhea. Pt is A&Ox4 and NAD. Ambulatory to room.

## 2020-03-02 NOTE — ED Notes (Signed)
Patient is being discharged from the Urgent Care and sent to the Emergency Department via Personal Vehicle . Per Tresa Endo NP, patient is in need of higher level of care due to Abdominal pain. Patient is aware and verbalizes understanding of plan of care.  Vitals:   03/02/20 0857  BP: (!) 140/94  Pulse: 100  Resp: 13  Temp: 97.9 F (36.6 C)  SpO2: 97%

## 2020-03-02 NOTE — ED Triage Notes (Signed)
Patient c/o RT lower quadrant ABD pain, constipation, nausea, and decreased appetite since last week (Wednesday).   Patient was directed to go to [facility] Emergency Department due to symptoms and necessity of a CT scan.   Patient stated they will got to [facility] Emergency Department.

## 2020-03-02 NOTE — ED Triage Notes (Signed)
Pt via POV from home. Pt c/o RLQ abdominal pain since Wednesday last week. Pt A&Ox4 and NAD. Denies NV but states he his having some diarrhea.

## 2020-03-02 NOTE — ED Provider Notes (Signed)
[facility] Emergency Department Provider Note   ____________________________________________    I have reviewed the triage vital signs and the nursing notes.   HISTORY  Chief Complaint Abdominal Pain     HPI Brent Faulkner is a 29 y.o. male who presents with complaints of right lower quadrant abdominal pain.  Pain started approximately 5 days ago, has been getting worse gradually, seems to improve during the day but worse in the mornings and evenings.  Is not take anything for this.  No history of abdominal surgeries.  No fevers or chills, decreased appetite, positive constipation.  Past Medical History:  Diagnosis Date  . History of multiple concussions     Patient Active Problem List   Diagnosis Date Noted  . Generalized anxiety disorder 03/18/2019  . Major depressive disorder 03/18/2019  . Shoulder pain, left 11/16/2018    Past Surgical History:  Procedure Laterality Date  . NO PAST SURGERIES      Prior to Admission medications   Medication Sig Start Date End Date Taking? Authorizing Provider  ciprofloxacin (CIPRO) 500 MG tablet Take 1 tablet (500 mg total) by mouth 2 (two) times daily for 10 days. 03/02/20 03/12/20  Jene Every, MD  hydrOXYzine (VISTARIL) 50 MG capsule Take 1 capsule (50 mg total) by mouth 3 (three) times daily as needed for anxiety. 02/10/20   [name], MD  Multiple Vitamins-Minerals (AIRBORNE PO) Take by mouth daily.    [provider]  naproxen (NAPROSYN) 375 MG tablet Take 1 tablet (375 mg total) by mouth 2 (two) times daily with a meal. 03/02/20   Jene Every, MD  sertraline (ZOLOFT) 50 MG tablet Take 1 tablet (50 mg total) by mouth daily. 05/27/19   [name], MD     Allergies Patient has no known allergies.  Family History  Problem Relation Age of Onset  . Addison's disease Mother   . Healthy Father   . Healthy Sister   . Diabetes Brother        Type 1  . Breast cancer Maternal  Grandmother   . Heart attack Maternal Grandfather 66  . Lung cancer Paternal Grandfather        tobacco use    Social History Social History   Tobacco Use  . Smoking status: Former Smoker    Packs/day: 0.25    Years: 4.00    Pack years: 1.00    Types: Cigarettes    Quit date: 04/05/2015    Years since quitting: 4.9  . Smokeless tobacco: Never Used  Vaping Use  . Vaping Use: Some days  . Start date: 05/06/2015  Substance Use Topics  . Alcohol use: Yes    Comment: 4 beers a week, 2 beers in a setting  . Drug use: No    Review of Systems  Constitutional: No fever/chills Eyes: No visual changes.  ENT: No sore throat. Cardiovascular: Denies chest pain. Respiratory: Denies shortness of breath. Gastrointestinal: As above Genitourinary: Negative for dysuria.  No hematuria  musculoskeletal: Negative for back pain. Skin: Negative for rash. Neurological: Negative for headaches or weakness   ____________________________________________   PHYSICAL EXAM:  VITAL SIGNS: ED Triage Vitals [03/02/20 0947]  Enc Vitals Group     BP 135/87     Pulse Rate (!) 101     Resp 18     Temp 98.9 F (37.2 C)     Temp Source Oral     SpO2 98 %     Weight  56.7 kg (125 lb)     Height 1.702 m (5\' 7" )     Head Circumference      Peak Flow      Pain Score 5     Pain Loc      Pain Edu?      Excl. in GC?     Constitutional: Alert and oriented.   Nose: No congestion/rhinnorhea. Mouth/Throat: Mucous membranes are moist.   Neck:  Painless ROM Cardiovascular: Normal rate, regular rhythm. Grossly normal heart sounds.  Good peripheral circulation. Respiratory: Normal respiratory effort.  No retractions. Lungs CTAB. Gastrointestinal: Soft, tenderness palpation right lower quadrant no distention.  No CVA tenderness.  Musculoskeletal: .  Warm and well perfused Neurologic:  Normal speech and language. No gross focal neurologic deficits are appreciated.  Skin:  Skin is warm, dry and intact. No  rash noted. Psychiatric: Mood and affect are normal. Speech and behavior are normal.  ____________________________________________   LABS (all labs ordered are listed, but only abnormal results are displayed)  Labs Reviewed  COMPREHENSIVE METABOLIC PANEL - Abnormal; Notable for the following components:      Result Value   Glucose, Bld 120 (*)    Total Protein 9.4 (*)    Albumin 5.5 (*)    All other components within normal limits  CBC - Abnormal; Notable for the following components:   WBC 11.1 (*)    Hemoglobin 17.5 (*)    All other components within normal limits  URINALYSIS, COMPLETE (UACMP) WITH MICROSCOPIC - Abnormal; Notable for the following components:   Color, Urine STRAW (*)    APPearance CLEAR (*)    All other components within normal limits  LIPASE, BLOOD   ____________________________________________  EKG   ____________________________________________  RADIOLOGY  CT abdomen pelvis reviewed by me ____________________________________________   PROCEDURES  Procedure(s) performed: No  Procedures   Critical Care performed: No ____________________________________________   INITIAL IMPRESSION / ASSESSMENT AND PLAN / ED COURSE  Pertinent labs & imaging results that were available during my care of the patient were reviewed by me and considered in my medical decision making (see chart for details).  Patient presents with right lower quadrant abdominal pain as described above.  Suspicious for appendicitis although time course is not quite consistent, mild elevation white blood cell count.  Differential also includes UTI, ureterolithiasis, constipation.  We will treat with IV morphine, IV Zofran and obtain CT abdomen pelvis and reevaluate  CT without evidence of acute appendicitis, patient feeling better after IV morphine.  Question colitis/constipation as the cause of his pain, possible muscle injury.  Will discharge with supportive care, outpatient  follow-up/return precautions discussed    ____________________________________________   FINAL CLINICAL IMPRESSION(S) / ED DIAGNOSES  Final diagnoses:  Right lower quadrant abdominal pain        Note:  This document was prepared using Dragon voice recognition software and may include unintentional dictation errors.   , MD 03/02/20 1323

## 2020-03-06 ENCOUNTER — Encounter: Payer: Self-pay | Admitting: Family Medicine

## 2020-03-16 ENCOUNTER — Ambulatory Visit: Payer: BC Managed Care – PPO | Admitting: Gastroenterology

## 2020-03-25 ENCOUNTER — Ambulatory Visit: Payer: BC Managed Care – PPO | Admitting: Gastroenterology

## 2020-04-02 ENCOUNTER — Other Ambulatory Visit: Payer: Self-pay | Admitting: Family Medicine

## 2020-04-02 DIAGNOSIS — F410 Panic disorder [episodic paroxysmal anxiety] without agoraphobia: Secondary | ICD-10-CM

## 2020-04-02 DIAGNOSIS — F411 Generalized anxiety disorder: Secondary | ICD-10-CM

## 2020-04-02 MED ORDER — HYDROXYZINE PAMOATE 50 MG PO CAPS: 50.0000 mg | ORAL_CAPSULE | Freq: Three times a day (TID) | ORAL | 0 refills | Status: DC | PRN

## 2020-04-02 NOTE — Telephone Encounter (Signed)
Pharmacy requests refill on: Hydroxyzine 50 mg   LAST REFILL: 02/10/2020 (Q-30, R-0) LAST OV: 05/27/2019 NEXT OV: Not Scheduled  PHARMACY: [id number] [place], [place]

## 2020-04-07 ENCOUNTER — Other Ambulatory Visit: Payer: Self-pay

## 2020-04-08 ENCOUNTER — Encounter: Payer: Self-pay | Admitting: Gastroenterology

## 2020-04-08 ENCOUNTER — Ambulatory Visit (INDEPENDENT_AMBULATORY_CARE_PROVIDER_SITE_OTHER): Payer: BC Managed Care – PPO | Admitting: Gastroenterology

## 2020-04-08 VITALS — BP 113/77 | HR 76 | Temp 97.8°F | Ht 68.0 in | Wt 125.6 lb

## 2020-04-08 DIAGNOSIS — R718 Other abnormality of red blood cells: Secondary | ICD-10-CM

## 2020-04-08 DIAGNOSIS — D582 Other hemoglobinopathies: Secondary | ICD-10-CM | POA: Diagnosis not present

## 2020-04-08 DIAGNOSIS — K581 Irritable bowel syndrome with constipation: Secondary | ICD-10-CM

## 2020-04-08 DIAGNOSIS — R7989 Other specified abnormal findings of blood chemistry: Secondary | ICD-10-CM

## 2020-04-08 DIAGNOSIS — D649 Anemia, unspecified: Secondary | ICD-10-CM | POA: Diagnosis not present

## 2020-04-08 NOTE — Progress Notes (Signed)
Brent Mood MD, MRCP(U.K) [address]  [address]  [place], [place] [address]  Main: [phone]  Fax: [phone]   Gastroenterology Consultation  Referring Provider:     [name], MD Primary Care Physician:  Brent Child, MD Primary Gastroenterologist:  Dr. Wyline Faulkner  Reason for Consultation:     Right lower quadrant pain        HPI:   Brent Faulkner is a 30 y.o. y/o male referred for consultation & management  by Dr. Selena Faulkner, Brent Heck, MD.     He presented to the emergency room on 03/02/2020 with right lower quadrant pain of 5 days duration.Underwent a CT scan of the abdomen and pelvis with contrast that demonstrated no abnormalities.  Lab work revealed a normal lipase, urine analysis, CMP.  IMA globin is elevated at 17.5.  He sent an email to his doctor saying that he could only have 1 bowel movement a day and subsequently was referred to see me.  He states that for the past few months he has had some lower abdominal discomfort every time he wakes up in the morning and it is usually relieved by bowel movement.  His stools have been harder at times when he has noticed this discomfort.  No blood in the stool no change in shape of the stool.  No family history of colon cancer or polyps.  No weight loss.  He also has some dryness in his throat when he wakes up in the morning which gets better later.  Lower GI symptoms are usually worse with stress and he has been under a bit of stress recently.  Past Medical History:  Diagnosis Date  . History of multiple concussions     Past Surgical History:  Procedure Laterality Date  . NO PAST SURGERIES      Prior to Admission medications   Medication Sig Start Date End Date Taking? Authorizing Provider  hydrOXYzine (VISTARIL) 50 MG capsule Take 1 capsule (50 mg total) by mouth 3 (three) times daily as needed for anxiety. 04/02/20  Yes Brent Child, MD  Multiple Vitamins-Minerals (AIRBORNE PO) Take by mouth daily.   Yes  [provider]  sertraline (ZOLOFT) 50 MG tablet Take 1 tablet (50 mg total) by mouth daily. 05/27/19  Yes Brent Child, MD    Family History  Problem Relation Age of Onset  . Addison's disease Mother   . Healthy Father   . Healthy Sister   . Diabetes Brother        Type 1  . Breast cancer Maternal Grandmother   . Heart attack Maternal Grandfather 66  . Lung cancer Paternal Grandfather        tobacco use     Social History   Tobacco Use  . Smoking status: Former Smoker    Packs/day: 0.25    Years: 4.00    Pack years: 1.00    Types: Cigarettes    Quit date: 04/05/2015    Years since quitting: 5.0  . Smokeless tobacco: Never Used  Vaping Use  . Vaping Use: Some days  . Start date: 05/06/2015  Substance Use Topics  . Alcohol use: Yes    Comment: 4 beers a week, 2 beers in a setting  . Drug use: No    Allergies as of 04/08/2020  . (No Known Allergies)    Review of Systems:    All systems reviewed and negative except where noted in HPI.   Physical Exam:  BP 113/77   Pulse 76   Temp 97.8 F (36.6 C) (Oral)   Ht 5\' 8"  (1.727 m)   Wt 125 lb 9.6 oz (57 kg)   BMI 19.10 kg/m  No LMP for male patient. Psych:  Alert and cooperative. Normal Faulkner and affect. General:   Alert,  Well-developed, well-nourished, pleasant and cooperative in NAD Head:  Normocephalic and atraumatic. Eyes:  Sclera clear, no icterus.   Conjunctiva pink. Lungs:  Respirations even and unlabored.  Clear throughout to auscultation.   No wheezes, crackles, or rhonchi. No acute distress. Heart:  Regular rate and rhythm; no murmurs, clicks, rubs, or gallops. Abdomen:  Normal bowel sounds.  No bruits.  Soft, non-tender and non-distended without masses, hepatosplenomegaly or hernias noted.  No guarding or rebound tenderness.    Lymph Nodes:  No significant cervical adenopathy. Psych:  Alert and cooperative. Normal Faulkner and affect.  Imaging Studies: No results found.  Assessment and Plan:    Brent Faulkner is a 30 y.o. y/o male has been referred for right lower quadrant pain.  His abdominal symptoms are suggestive of irritable bowel syndrome with constipation.  His dryness in his throat when she wakes up in the morning could be multifactorial which could range from reflux to allergies to a cat that he has, he also be due to dryness of his throat.  Suggested him to change filters of his HVAC, trial of Pepcid at night.  In terms of his irritable bowel syndrome with constipation I have suggested him to increase his dietary fiber to a target of 30 g/day.  [facility] information on high-fiber diet provided.  I will also provide him samples of IBgard to use as needed.  Advised him to address stress in his life.  I will see him back in 4 weeks and if no better will consider colonoscopy and evaluation.  I incidentally noted an elevated hemoglobin at his ER visit I will recheck it today to ensure that it has returned back to normal.  Follow up in 4 weeks  Dr 26 MD,MRCP(U.K)

## 2020-04-09 ENCOUNTER — Encounter: Payer: Self-pay | Admitting: Gastroenterology

## 2020-04-09 LAB — CBC WITH DIFFERENTIAL/PLATELET
Basophils Absolute: 0.1 10*3/uL (ref 0.0–0.2)
Basos: 1 %
EOS (ABSOLUTE): 0.1 10*3/uL (ref 0.0–0.4)
Eos: 1 %
Hematocrit: 46.2 % (ref 37.5–51.0)
Hemoglobin: 16.1 g/dL (ref 13.0–17.7)
Immature Grans (Abs): 0 10*3/uL (ref 0.0–0.1)
Immature Granulocytes: 0 %
Lymphocytes Absolute: 1.5 10*3/uL (ref 0.7–3.1)
Lymphs: 19 %
MCH: 30.7 pg (ref 26.6–33.0)
MCHC: 34.8 g/dL (ref 31.5–35.7)
MCV: 88 fL (ref 79–97)
Monocytes Absolute: 0.5 10*3/uL (ref 0.1–0.9)
Monocytes: 6 %
Neutrophils Absolute: 5.7 10*3/uL (ref 1.4–7.0)
Neutrophils: 73 %
Platelets: 246 10*3/uL (ref 150–450)
RBC: 5.25 x10E6/uL (ref 4.14–5.80)
RDW: 11.9 % (ref 11.6–15.4)
WBC: 7.8 10*3/uL (ref 3.4–10.8)

## 2020-04-29 ENCOUNTER — Encounter: Payer: Self-pay | Admitting: Family Medicine

## 2020-04-29 DIAGNOSIS — F411 Generalized anxiety disorder: Secondary | ICD-10-CM

## 2020-04-29 DIAGNOSIS — F320 Major depressive disorder, single episode, mild: Secondary | ICD-10-CM

## 2020-04-29 MED ORDER — SERTRALINE HCL 50 MG PO TABS: 50.0000 mg | ORAL_TABLET | Freq: Every day | ORAL | 0 refills | Status: DC

## 2020-05-06 ENCOUNTER — Ambulatory Visit: Payer: BC Managed Care – PPO | Admitting: Gastroenterology

## 2020-05-20 ENCOUNTER — Encounter: Payer: BC Managed Care – PPO | Admitting: Family Medicine

## 2020-05-28 ENCOUNTER — Encounter: Payer: BC Managed Care – PPO | Admitting: Family Medicine

## 2020-06-25 ENCOUNTER — Encounter: Payer: Self-pay | Admitting: Family Medicine

## 2020-06-25 ENCOUNTER — Other Ambulatory Visit: Payer: Self-pay

## 2020-06-25 ENCOUNTER — Ambulatory Visit (INDEPENDENT_AMBULATORY_CARE_PROVIDER_SITE_OTHER): Payer: BC Managed Care – PPO | Admitting: Family Medicine

## 2020-06-25 VITALS — BP 130/62 | HR 57 | Temp 98.1°F | Ht 66.8 in | Wt 122.0 lb

## 2020-06-25 DIAGNOSIS — Z0000 Encounter for general adult medical examination without abnormal findings: Secondary | ICD-10-CM | POA: Diagnosis not present

## 2020-06-25 DIAGNOSIS — F411 Generalized anxiety disorder: Secondary | ICD-10-CM

## 2020-06-25 DIAGNOSIS — F320 Major depressive disorder, single episode, mild: Secondary | ICD-10-CM | POA: Diagnosis not present

## 2020-06-25 DIAGNOSIS — F40243 Fear of flying: Secondary | ICD-10-CM | POA: Insufficient documentation

## 2020-06-25 MED ORDER — ALPRAZOLAM 0.25 MG PO TABS: 0.2500 mg | ORAL_TABLET | Freq: Two times a day (BID) | ORAL | 0 refills | Status: DC | PRN

## 2020-06-25 MED ORDER — SERTRALINE HCL 50 MG PO TABS: 75.0000 mg | ORAL_TABLET | Freq: Every day | ORAL | 1 refills | Status: DC

## 2020-06-25 NOTE — Patient Instructions (Addendum)
#  Anxiety - Increase zoloft to 75 mg - can try xanax 0.25-0.5 mg as needed for panic attack  Hopefully the zoloft will decrease the symptoms of anxiety/panic  Healthy diet and regular exercise

## 2020-06-25 NOTE — Assessment & Plan Note (Signed)
Worsening anxiety w/ panic attacks. Increase zoloft 50>75 mg. Trial of xanax 0.25 mg prn for panic episodes if hydroxyzine not effective

## 2020-06-25 NOTE — Assessment & Plan Note (Signed)
Cont hydroxyzine. Xanax 0.25 mg if ineffective.

## 2020-06-25 NOTE — Progress Notes (Addendum)
Annual Exam   Chief Complaint:  Chief Complaint  Patient presents with  . Annual Exam  . Anxiety    Really bad lately due to parents going thru divorce. Hydroxyzine not helping like it used to.     History of Present Illness:  Brent Faulkner is a 30 y.o. presents today for annual examination.    Panic attacks  - at least 1 time per week over the last month - depression is good - no trigger - parents going through a divorce - hydroxyzine was not effective the last few times   Nutrition/Lifestyle Diet: no caffeine, fruits/veggies, meat at dinnertime, no alcohol Exercise: bike riding at home, weight training He is single partner, contraception - condoms most of the time and partner also on birth control.  Any issues getting or maintaining an erection? no  Social History   Tobacco Use  Smoking Status Former Smoker  . Packs/day: 0.25  . Years: 4.00  . Pack years: 1.00  . Types: Cigarettes  . Quit date: 04/05/2015  . Years since quitting: 5.2  Smokeless Tobacco Never Used   Social History   Substance and Sexual Activity  Alcohol Use Not Currently   Social History   Substance and Sexual Activity  Drug Use No     Safety The patient wears seatbelts: yes.     The patient feels safe at home and in their relationships: yes.  General Health Dentist in the last year: Yes Eye doctor: yes  Weight Wt Readings from Last 3 Encounters:  06/25/20 122 lb (55.3 kg)  04/08/20 125 lb 9.6 oz (57 kg)  03/02/20 125 lb (56.7 kg)   Patient has normal BMI  BMI Readings from Last 1 Encounters:  06/25/20 19.22 kg/m     Chronic disease screening Blood pressure monitoring:  BP Readings from Last 3 Encounters:  06/25/20 130/62  04/08/20 113/77  03/02/20 135/87    Lipid Monitoring: Indication for screening: age >35, obesity, diabetes, family hx, CV risk factors.  Lipid screening: Yes  No results found for: CHOL, HDL, LDLCALC, LDLDIRECT, TRIG, CHOLHDL   Diabetes  Screening: age >13, overweight, family hx, PCOS, hx of gestational diabetes, at risk ethnicity, elevated blood pressure >135/80.  Diabetes Screening screening: Not Indicated  No results found for: HGBA1C   Immunization History  Administered Date(s) Administered  . Hepatitis A 05/31/2005, 12/07/2005  . Hepatitis B 05/20/1992, 08/06/1992, 12/31/1992  . MMR 06/23/1993, 06/17/1996  . Tdap 04/12/2004, 05/10/2018    Past Medical History:  Diagnosis Date  . History of multiple concussions     Past Surgical History:  Procedure Laterality Date  . NO PAST SURGERIES      Prior to Admission medications   Medication Sig Start Date End Date Taking? Authorizing Provider  hydrOXYzine (VISTARIL) 50 MG capsule Take 1 capsule (50 mg total) by mouth 3 (three) times daily as needed for anxiety. 04/02/20   Lesleigh Noe, MD  Multiple Vitamins-Minerals (AIRBORNE PO) Take by mouth daily.    [provider]  sertraline (ZOLOFT) 50 MG tablet Take 1 tablet (50 mg total) by mouth daily. 04/29/20   Lesleigh Noe, MD    No Known Allergies   Social History   Socioeconomic History  . Marital status: Single    Spouse name: Not on file  . Number of children: Not on file  . Years of education: Therapist, nutritional  . Highest education level: Not on file  Occupational History  . Not on file  Tobacco  Use  . Smoking status: Former Smoker    Packs/day: 0.25    Years: 4.00    Pack years: 1.00    Types: Cigarettes    Quit date: 04/05/2015    Years since quitting: 5.2  . Smokeless tobacco: Never Used  Vaping Use  . Vaping Use: Some days  . Start date: 05/06/2015  Substance and Sexual Activity  . Alcohol use: Not Currently  . Drug use: No  . Sexual activity: Yes    Birth control/protection: Condom  Other Topics Concern  . 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 Addison's disease Mother   . Healthy Father   . Healthy Sister   . Diabetes Brother        Type 1  . Breast cancer Maternal Grandmother   . Heart attack Maternal Grandfather 66  . Lung cancer Paternal Grandfather        tobacco use    Review of Systems  Constitutional: Negative for chills and fever.  HENT: Negative for congestion and sore throat.   Eyes: Negative for blurred vision and double vision.  Respiratory: Negative for shortness of breath.   Cardiovascular: Negative for chest pain.  Gastrointestinal: Negative for heartburn, nausea and vomiting.  Genitourinary: Negative.   Musculoskeletal: Negative.  Negative for myalgias.  Skin: Negative for rash.  Neurological: Negative for dizziness and headaches.  Endo/Heme/Allergies: Does not bruise/bleed easily.  Psychiatric/Behavioral: Negative for depression. The patient is nervous/anxious.      Physical Exam BP 130/62   Pulse (!) 57   Temp 98.1 F (36.7 C) (Temporal)   Ht 5' 6.8" (1.697 m)   Wt 122 lb (55.3 kg)   SpO2 98%   BMI 19.22 kg/m    BP Readings from Last 3 Encounters:  06/25/20 130/62  04/08/20 113/77  03/02/20 135/87      Physical Exam Constitutional:      General: He is not in acute distress.    Appearance: He is well-developed. He is not diaphoretic.  HENT:     Head: Normocephalic and atraumatic.     Right Ear: Tympanic membrane and ear canal normal.     Left Ear: Tympanic membrane and ear canal normal.     Nose: Nose normal.      Mouth/Throat:     Pharynx: Uvula midline.  Eyes:     General: No scleral icterus.    Conjunctiva/sclera: Conjunctivae normal.     Pupils: Pupils are equal, round, and reactive to light.  Cardiovascular:     Rate and Rhythm: Normal rate and regular rhythm.     Heart sounds: Normal heart sounds. No murmur heard.   Pulmonary:     Effort: Pulmonary effort is normal. No respiratory distress.     Breath sounds: Normal breath sounds. No wheezing.  Abdominal:  General: Bowel sounds are normal. There is no distension.     Palpations: Abdomen is soft. There is no mass.     Tenderness: There is no abdominal tenderness. There is no guarding.  Musculoskeletal:        General: Normal range of motion.     Cervical back: Normal range of motion and neck supple.  Lymphadenopathy:     Cervical: No cervical adenopathy.  Skin:    General: Skin is warm and dry.     Capillary Refill: Capillary refill takes less than 2 seconds.  Neurological:     Mental Status: He is alert and oriented to person, place, and time.        Results:  PHQ-9:  [place] Visit from 06/25/2020 in [place] [facility] [place]  PHQ-9 Total Score 3      GAD 7 : Generalized Anxiety Score 06/25/2020 03/18/2019 05/10/2018  Nervous, Anxious, on Edge '3 2 1  ' Control/stop worrying 2 2 0  Worry too much - different things '3 2 1  ' Trouble relaxing 2 1 0  Restless 2 1 0  Easily annoyed or irritable 3 2 0  Afraid - awful might happen 2 1 0  Total GAD 7 Score '17 11 2  ' Anxiety Difficulty Very difficult Somewhat difficult Somewhat difficult      Assessment: 30 y.o. here for routine annual physical examination.  Plan: Problem List Items Addressed This Visit      Other   Generalized anxiety disorder    Worsening anxiety w/ panic attacks. Increase zoloft 50>75 mg. Trial of xanax 0.25 mg prn for panic episodes if hydroxyzine not effective      Relevant Medications   sertraline (ZOLOFT) 50 MG tablet    ALPRAZolam (XANAX) 0.25 MG tablet   Major depressive disorder   Relevant Medications   sertraline (ZOLOFT) 50 MG tablet   ALPRAZolam (XANAX) 0.25 MG tablet   Anxiety with flying    Cont hydroxyzine. Xanax 0.25 mg if ineffective.       Relevant Medications   sertraline (ZOLOFT) 50 MG tablet   ALPRAZolam (XANAX) 0.25 MG tablet    Other Visit Diagnoses    Annual physical exam    -  Primary      Screening: -- Blood pressure screen normal -- cholesterol screening: not due for screening -- Weight screening: normal -- Diabetes Screening: not due for screening -- Nutrition: Encouraged healthy diet and exercise  The ASCVD Risk score Mikey Bussing DC Jr., et al., 2013) failed to calculate for the following reasons:   The 2013 ASCVD risk score is only valid for ages 74 to 36  -- Statin therapy for Age 42-75 with CVD risk >7.5%  Psych -- Depression screening (PHQ-9):  [place] Office Visit from 06/25/2020 in [place] at [facility]  PHQ-9 Total Score 3       Safety -- tobacco screening: not using -- alcohol screening:  low-risk usage. -- no evidence of domestic violence or intimate partner violence.   Cancer Screening -- No age related cancer screening due  Immunizations Immunization History  Administered Date(s) Administered  . Hepatitis A 05/31/2005, 12/07/2005  . Hepatitis B 05/20/1992, 08/06/1992, 12/31/1992  . MMR 06/23/1993, 06/17/1996  . Tdap 04/12/2004, 05/10/2018    -- flu vaccine up to date -- TDAP q10 years up to date -- Covid-19 Vaccine up to date   Encouraged regular vision and dental screening. Encouraged healthy exercise and diet.   Lesleigh Noe

## 2020-08-12 ENCOUNTER — Telehealth: Payer: BC Managed Care – PPO | Admitting: Family Medicine

## 2020-08-13 ENCOUNTER — Other Ambulatory Visit: Payer: Self-pay

## 2020-08-13 ENCOUNTER — Telehealth (INDEPENDENT_AMBULATORY_CARE_PROVIDER_SITE_OTHER): Payer: BC Managed Care – PPO | Admitting: Family Medicine

## 2020-08-13 VITALS — Ht 66.5 in | Wt 125.0 lb

## 2020-08-13 DIAGNOSIS — F411 Generalized anxiety disorder: Secondary | ICD-10-CM | POA: Diagnosis not present

## 2020-08-13 DIAGNOSIS — F40243 Fear of flying: Secondary | ICD-10-CM | POA: Diagnosis not present

## 2020-08-13 DIAGNOSIS — F320 Major depressive disorder, single episode, mild: Secondary | ICD-10-CM | POA: Diagnosis not present

## 2020-08-13 MED ORDER — ALPRAZOLAM 0.25 MG PO TABS: 0.2500 mg | ORAL_TABLET | Freq: Two times a day (BID) | ORAL | 0 refills | Status: DC | PRN

## 2020-08-13 NOTE — Assessment & Plan Note (Signed)
Responding well to xanax 0.25 mg prn. Pt will be taking a work trip at least 1 time per month. Will send in #30 for prn and flying needs.

## 2020-08-13 NOTE — Assessment & Plan Note (Signed)
Improved and controlled on zoloft 75 mg. Continue taking for at least 3-4 months. But can continue as long as needed.

## 2020-08-13 NOTE — Progress Notes (Signed)
I connected with Brent Faulkner on 08/13/20 at 11:00 AM EDT by video and verified that I am speaking with the correct person using two identifiers.   I discussed the limitations, risks, security and privacy concerns of performing an evaluation and management service by video and the availability of in person appointments. I also discussed with the patient that there may be a patient responsible charge related to this service. The patient expressed understanding and agreed to proceed.  Patient location: work Provider Location: [place] [facility] Participants: [name] and Brent Faulkner   Subjective:     Brent Faulkner is a 30 y.o. male presenting for Follow-up (anxiety)     HPI   #GAD - started to finally feel the difference with the zoloft last week - does well on normal days - with covid it is increasing some anxiety - around travel - used some xanax when he went to [place] has used ~7/10 - in a normal day is calmer than he was before - able to separate from work mode  - Travels once a month in general    Review of Systems  06/25/2020: Clinic - GAD worse - increase zoloft, xanax for panic.   Social History   Tobacco Use  Smoking Status Former Smoker  . Packs/day: 0.25  . Years: 4.00  . Pack years: 1.00  . Types: Cigarettes  . Quit date: 04/05/2015  . Years since quitting: 5.3  Smokeless Tobacco Never Used        Objective:   BP Readings from Last 3 Encounters:  06/25/20 130/62  04/08/20 113/77  03/02/20 135/87   Wt Readings from Last 3 Encounters:  08/13/20 125 lb (56.7 kg)  06/25/20 122 lb (55.3 kg)  04/08/20 125 lb 9.6 oz (57 kg)   Ht 5' 6.5" (1.689 m)   Wt 125 lb (56.7 kg)   BMI 19.87 kg/m    Physical Exam Constitutional:      Appearance: Normal appearance. He is not ill-appearing.  HENT:     Head: Normocephalic and atraumatic.     Right Ear: External ear normal.     Left Ear: External ear normal.  Eyes:     Conjunctiva/sclera:  Conjunctivae normal.  Pulmonary:     Effort: Pulmonary effort is normal. No respiratory distress.  Neurological:     Mental Status: He is alert. Mental status is at baseline.  Psychiatric:        Mood and Affect: Mood normal.        Behavior: Behavior normal.        Thought Content: Thought content normal.        Judgment: Judgment normal.         GAD 7 : Generalized Anxiety Score 08/13/2020 06/25/2020 03/18/2019 05/10/2018  Nervous, Anxious, on Edge 1 3 2 1   Control/stop worrying 1 2 2  0  Worry too much - different things 1 3 2 1   Trouble relaxing 0 2 1 0  Restless 1 2 1  0  Easily annoyed or irritable 1 3 2  0  Afraid - awful might happen 0 2 1 0  Total GAD 7 Score 5 17 11 2   Anxiety Difficulty Somewhat difficult Very difficult Somewhat difficult Somewhat difficult   Depression screen [facility] 2/9 06/25/2020 03/18/2019 05/10/2018  Decreased Interest 0 1 1  Down, Depressed, Hopeless 1 1 1   PHQ - 2 Score 1 2 2   Altered sleeping 0 0 0  Tired, decreased energy 0 2 1  Change  in appetite 0 0 0  Feeling bad or failure about yourself  1 1 1   Trouble concentrating 0 2 0  Moving slowly or fidgety/restless 1 1 0  Suicidal thoughts 0 0 0  PHQ-9 Score 3 8 4   Difficult doing work/chores Somewhat difficult Very difficult Somewhat difficult         Assessment & Plan:   Problem List Items Addressed This Visit      Other   Generalized anxiety disorder    Improved and controlled on zoloft 75 mg. Continue taking for at least 3-4 months. But can continue as long as needed.       Relevant Medications   ALPRAZolam (XANAX) 0.25 MG tablet   Major depressive disorder - Primary    Controlled on zoloft 75 mg. Cont medication. Discussed light therapy for some seasonal affective symptoms in the winter.       Relevant Medications   ALPRAZolam (XANAX) 0.25 MG tablet   Anxiety with flying    Responding well to xanax 0.25 mg prn. Pt will be taking a work trip at least 1 time per month. Will send in  #30 for prn and flying needs.       Relevant Medications   ALPRAZolam (XANAX) 0.25 MG tablet       Return in about 1 year (around 08/13/2021) for annual or sooner as needed.  06-20-1985, MD

## 2020-08-13 NOTE — Patient Instructions (Signed)
Seasonal Affective Disorder Seasonal affective disorder (SAD) is a type of depression. It is when you feel depressed at specific times of the year. SAD is most common during late fall and winter when the days are shorter and most people spend less time outdoors. This is why SAD is also known as the "winter blues." SAD occurs less commonly in the spring or summer. SAD can be mild to severe, and it can interfere with work, school, relationships, and normal daily activities. What are the causes? The cause of this condition is not known. It may be related to changes in brain chemistry that are caused by having less exposure to daylight. What increases the risk? You are more likely to develop this condition if:  You are male.  You live far [place] or far [place] of the equator. These areas get less sunlight and have longer winter seasons.  You have a personal history of depression or bipolar disorder.  You have a family history of mental health conditions. What are the signs or symptoms? Symptoms of this condition include:  Depressed mood, which may involve: ? Feeling sad or teary. ? Having crying spells.  Irritability.  Trouble sleeping, or sleeping more than usual.  Loss of interest in activities that you usually enjoy.  Feelings of guilt or worthlessness.  Restlessness or loss of energy.  Difficulty concentrating, remembering, or making decisions.  Significant change in appetite or weight.  Thinking about self-harm or attempting suicide. Symptoms associated with the winter pattern of SAD include:  Overeating or craving sweet foods.  Weight gain.  Avoiding social situations (social withdrawal), or feeling like "hibernating."  Sleeping more than usual. Symptoms associated with the less common summer pattern of SAD include:  Loss of appetite.  Weight loss.  Trouble sleeping.  Episodes of violent behavior (in severe cases). How is this diagnosed? This condition is  usually diagnosed through an assessment with your health care provider. You will be asked about your moods, thoughts, and behaviors. You will also be asked about your medical history, any major life changes, and any medicines and substances that you use. You may have a physical exam and blood tests to rule out other possible causes of your symptoms. You may be referred to a mental health specialist for more evaluation. How is this treated? Treatment for this condition may include:  Light therapy. This therapy involves sitting in front of a light source for 15-30 minutes every day. The light source may be: ? A light box. ? A dawn simulator or sunrise clock. This is a timer-activated light source that copies the sunrise by slowly becoming brighter. This can help to activate your body's internal clock.  Antidepressant medicine.  Cognitive behavioral therapy (CBT). CBT is a form of talk therapy that helps to identify and change negative thoughts that are associated with SAD.  Changes to your dietary, exercise, or sleeping habits. A healthy lifestyle may help to prevent or relieve symptoms.   Follow these instructions at home: Medicines  Take over-the-counter and prescription medicines only as told by your healthcare provider.  If you are taking antidepressant medicines, ask your health care provider what side effects you should be aware of.  Talk with your health care provider before you start taking any new prescription or over-the-counter medicines, herbs, or supplements. Lifestyle  Eat a healthy diet that includes fruits and vegetables, whole grains, and lean proteins.  Get plenty of sleep. To improve your sleep, make sure you: ? Keep your bedroom dark  and cool. ? Go to sleep and wake up at about the same time every day. ? Do not keep screens (such as a TV or smartphone) in your bedroom. Limit your screen time starting a few hours before bedtime.  Exercise regularly.  Limit alcohol  and caffeine as told by your health care provider. General instructions  Make your home and work environment as sunny or bright as possible. Open window blinds and move furniture closer to windows.  Spend as much time outside as possible.  Use light therapy for 15-30 minutes every day, or as often as directed.  Attend CBT therapy sessions as directed.  Keep all follow-up visits as told by your health care provider and therapist. This is important. Contact a health care provider if:  Your symptoms do not get better or they get worse.  You have trouble taking care of yourself.  You are using drugs or alcohol to cope with your symptoms.  You have side effects from medicines. Get help right away if:  You have thoughts about hurting yourself or others. If you ever feel like you may hurt yourself or others, or have thoughts about taking your own life, get help right away. You can go to your nearest emergency department or call:  Your local emergency services (911 in the [place]  A suicide crisis helpline, such as the National Suicide Prevention Lifeline at 231-441-7076. This is open 24 hours a day. Summary  Seasonal affective disorder (SAD) is a type of depression that is associated with specific times of the year (usually fall and winter).  This condition may be treated with light therapy, talk therapy, and antidepressant medicines.  To help treat your condition, take good care of yourself and make home and work as sunny and bright as possible.  Seek help right away if you have thoughts about hurting yourself or others. This information is not intended to replace advice given to you by your health care provider. Make sure you discuss any questions you have with your health care provider. Document Revised: 09/12/2019 Document Reviewed: 09/12/2019 Elsevier Patient Education  2021 [employer].

## 2020-08-13 NOTE — Assessment & Plan Note (Addendum)
Controlled on zoloft 75 mg. Cont medication. Discussed light therapy for some seasonal affective symptoms in the winter.

## 2020-10-27 ENCOUNTER — Other Ambulatory Visit: Payer: Self-pay | Admitting: Family Medicine

## 2020-10-27 DIAGNOSIS — F40243 Fear of flying: Secondary | ICD-10-CM

## 2020-10-27 DIAGNOSIS — F411 Generalized anxiety disorder: Secondary | ICD-10-CM

## 2020-10-27 MED ORDER — ALPRAZOLAM 0.25 MG PO TABS: 0.2500 mg | ORAL_TABLET | Freq: Two times a day (BID) | ORAL | 0 refills | Status: DC | PRN

## 2020-10-27 NOTE — Telephone Encounter (Signed)
Last OV - 06/25/2020 Next OV - N/A not scheduled  Last Filled - 08/13/2020

## 2021-04-19 ENCOUNTER — Encounter: Payer: Self-pay | Admitting: Nurse Practitioner

## 2021-04-19 ENCOUNTER — Telehealth (INDEPENDENT_AMBULATORY_CARE_PROVIDER_SITE_OTHER): Payer: BC Managed Care – PPO | Admitting: Nurse Practitioner

## 2021-04-19 ENCOUNTER — Encounter: Payer: Self-pay | Admitting: Family Medicine

## 2021-04-19 DIAGNOSIS — F411 Generalized anxiety disorder: Secondary | ICD-10-CM

## 2021-04-19 DIAGNOSIS — F320 Major depressive disorder, single episode, mild: Secondary | ICD-10-CM

## 2021-04-19 MED ORDER — BUSPIRONE HCL 5 MG PO TABS: 5.0000 mg | ORAL_TABLET | Freq: Two times a day (BID) | ORAL | 0 refills | Status: DC

## 2021-04-19 NOTE — Progress Notes (Signed)
Patient ID: Brent Faulkner, male    DOB: [birth year], 31 y.o.   MRN: [id number]  Virtual visit completed through [employer], a video enabled telemedicine application. Due to national recommendations of social distancing due to COVID-19, a virtual visit is felt to be most appropriate for this patient at this time. Reviewed limitations, risks, security and privacy concerns of performing a virtual visit and the availability of in person appointments. I also reviewed that there may be a patient responsible charge related to this service. The patient agreed to proceed.   Patient location: Work Provider location: Financial controller at [employer], office Persons participating in this virtual visit: patient, provider   If any vitals were documented, they were collected by patient at home unless specified below.    There were no vitals taken for this visit.   CC: Panic Attacks Subjective:   HPI: Brent Faulkner is a 31 y.o. male presenting on 04/19/2021 for Panic Attack (Happened 2 times the past 2 to 3 days. Usually gets Xanax RX once a year. Took Xanax this past weekend and it made him nauseas and would like to discuss alternative for this medication.)  States he started having panic attacks on Friday-Sunday.  States that he has xanax for prn flying. States that he took 2 xanax and it made him really nauseous and made him vomit States his heart start racing, ears ringing, flushed, breathing faster than normal. Did check his O2 sat and it never went below 99%.. States it was just an overwhelming since of something is going to happen. Episodes happened the past three days and lasted approx 30-38mins.  States that he takes the zoloft in the morning and has done well with that. He did try to take the xanax but made him sick. He has been on hydroxyzine in the past without great relief.     PHQ9 SCORE ONLY 04/19/2021 06/25/2020 03/18/2019  PHQ-9 Total Score 7 3 8     GAD 7 : Generalized Anxiety Score 04/19/2021  08/13/2020 06/25/2020 03/18/2019  Nervous, Anxious, on Edge 3 1 3 2   Control/stop worrying 3 1 2 2   Worry too much - different things 3 1 3 2   Trouble relaxing 3 0 2 1  Restless 1 1 2 1   Easily annoyed or irritable 1 1 3 2   Afraid - awful might happen 2 0 2 1  Total GAD 7 Score 16 5 17 11   Anxiety Difficulty Very difficult Somewhat difficult Very difficult Somewhat difficult       Relevant past medical, surgical, family and social history reviewed and updated as indicated. Interim medical history since our last visit reviewed. Allergies and medications reviewed and updated. Outpatient Medications Prior to Visit  Medication Sig Dispense Refill   ALPRAZolam (XANAX) 0.25 MG tablet Take 1 tablet (0.25 mg total) by mouth 2 (two) times daily as needed for anxiety. 10 tablet 0   Multiple Vitamins-Minerals (AIRBORNE PO) Take by mouth daily.     sertraline (ZOLOFT) 50 MG tablet Take 1.5 tablets (75 mg total) by mouth daily. 135 tablet 1   No facility-administered medications prior to visit.     Per HPI unless specifically indicated in ROS section below Review of Systems  Respiratory:  Negative for shortness of breath.   Cardiovascular:  Negative for chest pain.  Gastrointestinal:  Negative for nausea and vomiting.  Psychiatric/Behavioral:  Negative for hallucinations and suicidal ideas. The patient is nervous/anxious.   Objective:  There were no vitals taken for this visit.  Wt Readings from Last 3 Encounters:  08/13/20 125 lb (56.7 kg)  06/25/20 122 lb (55.3 kg)  04/08/20 125 lb 9.6 oz (57 kg)       Physical exam: Gen: alert, NAD, not ill appearing Pulm: speaks in complete sentences without increased work of breathing Psych: normal mood, normal thought content      Results for orders placed or performed in visit on 04/08/20  CBC with Differential/Platelet  Result Value Ref Range   WBC 7.8 3.4 - 10.8 x10E3/uL   RBC 5.25 4.14 - 5.80 x10E6/uL   Hemoglobin 16.1 13.0 - 17.7 g/dL    Hematocrit 46.2 37.5 - 51.0 %   MCV 88 79 - 97 fL   MCH 30.7 26.6 - 33.0 pg   MCHC 34.8 31.5 - 35.7 g/dL   RDW 11.9 11.6 - 15.4 %   Platelets 246 150 - 450 x10E3/uL   Neutrophils 73 Not Estab. %   Lymphs 19 Not Estab. %   Monocytes 6 Not Estab. %   Eos 1 Not Estab. %   Basos 1 Not Estab. %   Neutrophils Absolute 5.7 1.4 - 7.0 x10E3/uL   Lymphocytes Absolute 1.5 0.7 - 3.1 x10E3/uL   Monocytes Absolute 0.5 0.1 - 0.9 x10E3/uL   EOS (ABSOLUTE) 0.1 0.0 - 0.4 x10E3/uL   Basophils Absolute 0.1 0.0 - 0.2 x10E3/uL   Immature Granulocytes 0 Not Estab. %   Immature Grans (Abs) 0.0 0.0 - 0.1 x10E3/uL   Assessment & Plan:   Problem List Items Addressed This Visit       Other   Generalized anxiety disorder - Primary    Patient currently maintained on 75 mg of Zoloft daily.  Also has alprazolam as needed for flying.  Describes several panic attacks over the weekend without any change in medication or life events.  States he tried taking an alprazolam it made him nauseous and threw up x2 separate days.  We will maintain Zoloft dosing and add BuSpar.  This is not help patient will follow up and let us know.  States he is going to make an appointment in office with primary care wants to return to office.  Did discuss "34" for mental health emergencies      Relevant Medications   busPIRone (BUSPAR) 5 MG tablet     Meds ordered this encounter  Medications   busPIRone (BUSPAR) 5 MG tablet    Sig: Take 1 tablet (5 mg total) by mouth 2 (two) times daily.    Dispense:  60 tablet    Refill:  0    Order Specific Question:   Supervising Provider    Answer:   TOWER, MARNE A [[id number]]   No orders of the defined types were placed in this encounter.   I discussed the assessment and treatment plan with the patient. The patient was provided an opportunity to ask questions and all were answered. The patient agreed with the plan and demonstrated an understanding of the instructions. The patient was advised  to call back or seek an in-person evaluation if the symptoms worsen or if the condition fails to improve as anticipated.  Follow up plan: Return if symptoms worsen or fail to improve.  Romilda Garret, NP

## 2021-04-19 NOTE — Assessment & Plan Note (Signed)
Patient currently maintained on 75 mg of Zoloft daily.  Also has alprazolam as needed for flying.  Describes several panic attacks over the weekend without any change in medication or life events.  States Brent Faulkner tried taking an alprazolam it made him nauseous and threw up x2 separate days.  We will maintain Zoloft dosing and add BuSpar.  This is not help patient will follow up and let us know.  States Brent Faulkner is going to make an appointment in office with primary care wants to return to office.  Did discuss "76" for mental health emergencies

## 2021-04-21 ENCOUNTER — Other Ambulatory Visit: Payer: Self-pay | Admitting: Family Medicine

## 2021-04-21 DIAGNOSIS — F411 Generalized anxiety disorder: Secondary | ICD-10-CM

## 2021-04-21 DIAGNOSIS — F40243 Fear of flying: Secondary | ICD-10-CM

## 2021-04-21 MED ORDER — ALPRAZOLAM 0.25 MG PO TABS: 0.2500 mg | ORAL_TABLET | Freq: Two times a day (BID) | ORAL | 0 refills | Status: DC | PRN

## 2021-04-21 NOTE — Telephone Encounter (Signed)
I refilled what Dr. Selena Batten had written I did not change the dose. Has he started on the buspar medication yet?

## 2021-05-03 MED ORDER — SERTRALINE HCL 50 MG PO TABS: 75.0000 mg | ORAL_TABLET | Freq: Every day | ORAL | 1 refills | Status: DC

## 2021-10-21 ENCOUNTER — Other Ambulatory Visit: Payer: Self-pay | Admitting: Nurse Practitioner

## 2021-10-21 DIAGNOSIS — F411 Generalized anxiety disorder: Secondary | ICD-10-CM

## 2021-10-21 DIAGNOSIS — F40243 Fear of flying: Secondary | ICD-10-CM

## 2021-10-22 MED ORDER — BUSPIRONE HCL 5 MG PO TABS: 5.0000 mg | ORAL_TABLET | Freq: Two times a day (BID) | ORAL | 0 refills | Status: DC

## 2021-10-22 MED ORDER — ALPRAZOLAM 0.25 MG PO TABS: 0.2500 mg | ORAL_TABLET | Freq: Two times a day (BID) | ORAL | 0 refills | Status: DC | PRN

## 2021-10-26 DIAGNOSIS — F411 Generalized anxiety disorder: Secondary | ICD-10-CM | POA: Diagnosis not present

## 2021-11-11 ENCOUNTER — Ambulatory Visit (INDEPENDENT_AMBULATORY_CARE_PROVIDER_SITE_OTHER): Payer: BC Managed Care – PPO | Admitting: Family Medicine

## 2021-11-11 VITALS — BP 100/60 | HR 80 | Temp 97.8°F | Ht 67.25 in | Wt 123.5 lb

## 2021-11-11 DIAGNOSIS — R7309 Other abnormal glucose: Secondary | ICD-10-CM

## 2021-11-11 DIAGNOSIS — F40243 Fear of flying: Secondary | ICD-10-CM | POA: Diagnosis not present

## 2021-11-11 DIAGNOSIS — Z0000 Encounter for general adult medical examination without abnormal findings: Secondary | ICD-10-CM | POA: Diagnosis not present

## 2021-11-11 DIAGNOSIS — F411 Generalized anxiety disorder: Secondary | ICD-10-CM

## 2021-11-11 DIAGNOSIS — F320 Major depressive disorder, single episode, mild: Secondary | ICD-10-CM | POA: Diagnosis not present

## 2021-11-11 LAB — COMPREHENSIVE METABOLIC PANEL
ALT: 13 U/L (ref 0–53)
AST: 18 U/L (ref 0–37)
Albumin: 5 g/dL (ref 3.5–5.2)
Alkaline Phosphatase: 61 U/L (ref 39–117)
BUN: 12 mg/dL (ref 6–23)
CO2: 28 mEq/L (ref 19–32)
Calcium: 10 mg/dL (ref 8.4–10.5)
Chloride: 103 mEq/L (ref 96–112)
Creatinine, Ser: 1.06 mg/dL (ref 0.40–1.50)
GFR: 94.75 mL/min (ref >60.00)
Glucose, Bld: 72 mg/dL (ref 70–99)
Potassium: 4.2 mEq/L (ref 3.5–5.1)
Sodium: 139 mEq/L (ref 135–145)
Total Bilirubin: 0.4 mg/dL (ref 0.2–1.2)
Total Protein: 8.3 g/dL (ref 6.0–8.3)

## 2021-11-11 LAB — LIPID PANEL
Cholesterol: 154 mg/dL (ref 0–200)
HDL: 43.9 mg/dL (ref >39.00)
LDL Cholesterol: 85 mg/dL (ref 0–99)
NonHDL: 110.35
Total CHOL/HDL Ratio: 4
Triglycerides: 129 mg/dL (ref 0.0–149.0)
VLDL: 25.8 mg/dL (ref 0.0–40.0)

## 2021-11-11 MED ORDER — ALPRAZOLAM 0.25 MG PO TABS: 0.2500 mg | ORAL_TABLET | Freq: Two times a day (BID) | ORAL | 0 refills | Status: DC | PRN

## 2021-11-11 MED ORDER — SERTRALINE HCL 50 MG PO TABS: 75.0000 mg | ORAL_TABLET | Freq: Every day | ORAL | 1 refills | Status: DC

## 2021-11-11 MED ORDER — BUSPIRONE HCL 5 MG PO TABS: 5.0000 mg | ORAL_TABLET | Freq: Two times a day (BID) | ORAL | 1 refills | Status: DC

## 2021-11-11 NOTE — Progress Notes (Signed)
Annual Exam   Chief Complaint:  Chief Complaint  Patient presents with   Annual Exam    No concerns    Anxiety    Requesting more xanax due to flying more for work.     History of Present Illness:  Brent Faulkner is a 31 y.o. presents today for annual examination.    Flight anxiety is severe Flying 2-3 times per month  Nutrition/Lifestyle Diet: generally healthy - veggies for lunch, meat in the afternoon, hearty breakfast Exercise: working out 3 times a week He is single partner, contraception - IUD.  Any issues getting or maintaining an erection? no  Social History   Tobacco Use  Smoking Status Former   Packs/day: 0.25   Years: 4.00   Total pack years: 1.00   Types: Cigarettes   Quit date: 04/05/2015   Years since quitting: 6.6  Smokeless Tobacco Never   Social History   Substance and Sexual Activity  Alcohol Use Not Currently   Social History   Substance and Sexual Activity  Drug Use No     Safety The patient wears seatbelts: yes.     The patient feels safe at home and in their relationships: yes.  General Health Dentist in the last year: Yes Eye doctor: yes  Weight Wt Readings from Last 3 Encounters:  11/11/21 123 lb 8 oz (56 kg)  08/13/20 125 lb (56.7 kg)  06/25/20 122 lb (55.3 kg)   Patient has normal BMI  BMI Readings from Last 1 Encounters:  11/11/21 19.20 kg/m     Chronic disease screening Blood pressure monitoring:  BP Readings from Last 3 Encounters:  11/11/21 100/60  06/25/20 130/62  04/08/20 113/77    Lipid Monitoring: Indication for screening: age >35, obesity, diabetes, family hx, CV risk factors.  Lipid screening: Yes  No results found for: "CHOL", "HDL", "LDLCALC", "LDLDIRECT", "TRIG", "CHOLHDL"   Diabetes Screening: age >61, overweight, family hx, PCOS, hx of gestational diabetes, at risk ethnicity, elevated blood pressure >135/80.  Diabetes Screening screening: Yes  No results found for: "HGBA1C"   Immunization  History  Administered Date(s) Administered   Hepatitis A 05/31/2005, 12/07/2005   Hepatitis B 05/20/1992, 08/06/1992, 12/31/1992   MMR 06/23/1993, 06/17/1996   Tdap 04/12/2004, 05/10/2018    Past Medical History:  Diagnosis Date   History of multiple concussions     Past Surgical History:  Procedure Laterality Date   NO PAST SURGERIES      Prior to Admission medications   Medication Sig Start Date End Date Taking? Authorizing Provider  ALPRAZolam (XANAX) 0.25 MG tablet Take 1 tablet (0.25 mg total) by mouth 2 (two) times daily as needed for anxiety. 10/22/21  Yes Lesleigh Noe, MD  busPIRone (BUSPAR) 5 MG tablet Take 1 tablet (5 mg total) by mouth 2 (two) times daily. 10/22/21  Yes Lesleigh Noe, MD  Multiple Vitamins-Minerals (AIRBORNE PO) Take by mouth daily.   Yes [provider]  sertraline (ZOLOFT) 50 MG tablet Take 1.5 tablets (75 mg total) by mouth daily. 05/03/21  Yes Lesleigh Noe, MD    No Known Allergies   Social History   Socioeconomic History   Marital status: Single    Spouse name: Not on file   Number of children: Not on file   Years of education: Bachelors of Science   Highest education level: Not on file  Occupational History   Not on file  Tobacco Use   Smoking status: Former    Packs/day: 0.25  Years: 4.00    Total pack years: 1.00    Types: Cigarettes    Quit date: 04/05/2015    Years since quitting: 6.6   Smokeless tobacco: Never  Vaping Use   Vaping Use: Some days   Start date: 05/06/2015  Substance and Sexual Activity   Alcohol use: Not Currently   Drug use: No   Sexual activity: Yes    Birth control/protection: Condom  Other Topics Concern   Not on file  Social History Narrative   Lives with best friend (roommate)   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, fashion/clothing and sell 3-D   Enjoys: couching baseball, attending athletic events, movies, social   Exercise: bike x 1 hour, and HIIT working out daily   Diet: better now - cut caffeine out,  tries to eat veggies, and meat   Social Determinants of Health   Financial Resource Strain: Low Risk  (05/10/2018)   Overall Financial Resource Strain (CARDIA)    Difficulty of Paying Living Expenses: Not hard at all  Food Insecurity: Not on file  Transportation Needs: Not on file  Physical Activity: Not on file  Stress: Not on file  Social Connections: Not on file  Intimate Partner Violence: Not on file    Family History  Problem Relation Age of Onset   Addison's disease Mother    Healthy Father    Healthy Sister    Diabetes Brother        Type 1   Breast cancer Maternal Grandmother    Heart attack Maternal Grandfather 66   Lung cancer Paternal Grandfather        tobacco use    Review of Systems  Constitutional:  Negative for chills and fever.  HENT:  Negative for congestion and sore throat.   Eyes:  Negative for blurred vision and double vision.  Respiratory:  Negative for shortness of breath.   Cardiovascular:  Negative for chest pain.  Gastrointestinal:  Negative for heartburn, nausea and vomiting.  Genitourinary: Negative.   Musculoskeletal: Negative.  Negative for myalgias.  Skin:  Negative for rash.  Neurological:  Negative for dizziness and headaches.  Endo/Heme/Allergies:  Does not bruise/bleed easily.  Psychiatric/Behavioral:  Negative for depression. The patient is not nervous/anxious.      Physical Exam BP 100/60   Pulse 80   Temp 97.8 F (36.6 C) (Temporal)   Ht 5' 7.25" (1.708 m)   Wt 123 lb 8 oz (56 kg)   SpO2 98%   BMI 19.20 kg/m    BP Readings from Last 3 Encounters:  11/11/21 100/60  06/25/20 130/62  04/08/20 113/77      Physical Exam Constitutional:      General: He is not in acute distress.    Appearance: He is well-developed. He is not diaphoretic.  HENT:     Head: Normocephalic and atraumatic.     Right Ear: Tympanic membrane and ear canal normal.     Left Ear: Tympanic membrane and ear canal normal.     Nose: Nose normal.      Mouth/Throat:     Pharynx: Uvula midline.  Eyes:     General: No scleral icterus.    Conjunctiva/sclera: Conjunctivae normal.     Pupils: Pupils are equal, round, and reactive to light.  Cardiovascular:     Rate and Rhythm: Normal rate and regular rhythm.     Heart sounds: Normal heart sounds. No murmur heard. Pulmonary:     Effort: Pulmonary effort is normal. No respiratory distress.  Breath sounds: Normal breath sounds. No wheezing.  Abdominal:     General: Bowel sounds are normal. There is no distension.     Palpations: Abdomen is soft. There is no mass.     Tenderness: There is no abdominal tenderness. There is no guarding.  Musculoskeletal:        General: Normal range of motion.     Cervical back: Normal range of motion and neck supple.  Lymphadenopathy:     Cervical: No cervical adenopathy.  Skin:    General: Skin is warm and dry.     Capillary Refill: Capillary refill takes less than 2 seconds.  Neurological:     Mental Status: He is alert and oriented to person, place, and time.        Results:  PHQ-9:  Flowsheet Row Video Visit from 04/19/2021 in [facility] [facility]  PHQ-9 Total Score 7         Assessment: 31 y.o. here for routine annual physical examination.  Plan: Problem List Items Addressed This Visit       Other   Generalized anxiety disorder    Controlled, continue Zoloft.      Relevant Medications   busPIRone (BUSPAR) 5 MG tablet   sertraline (ZOLOFT) 50 MG tablet   ALPRAZolam (XANAX) 0.25 MG tablet   Major depressive disorder   Relevant Medications   busPIRone (BUSPAR) 5 MG tablet   sertraline (ZOLOFT) 50 MG tablet   ALPRAZolam (XANAX) 0.25 MG tablet   Anxiety with flying    Severe symptoms, patient's anxiety outside of flying is well-controlled on Zoloft and BuSpar.  He takes Xanax with good response, but has to fly at least 4 times per month.  Refill provided to reflect 6 months of flying.      Relevant  Medications   busPIRone (BUSPAR) 5 MG tablet   sertraline (ZOLOFT) 50 MG tablet   ALPRAZolam (XANAX) 0.25 MG tablet   Other Visit Diagnoses     Annual physical exam    -  Primary   Relevant Orders   Comprehensive metabolic panel   Lipid panel   Elevated glucose       Relevant Orders   Comprehensive metabolic panel       Screening: -- Blood pressure screen normal -- cholesterol screening: will obtain -- Weight screening: normal -- Diabetes Screening: will obtain -- Nutrition: Encouraged healthy diet and exercise  The ASCVD Risk score (Arnett DK, et al., 2019) failed to calculate for the following reasons:   The 2019 ASCVD risk score is only valid for ages 11 to 33  -- Statin therapy for Age 32-75 with CVD risk >7.5%  Psych -- Depression screening (PHQ-9):  Flowsheet Row Video Visit from 04/19/2021 in [place] [facility]  PHQ-9 Total Score 7        Safety -- tobacco screening: not using -- alcohol screening:  not using -- no evidence of domestic violence or intimate partner violence.   Cancer Screening -- No age related cancer screening due  Immunizations Immunization History  Administered Date(s) Administered   Hepatitis A 05/31/2005, 12/07/2005   Hepatitis B 05/20/1992, 08/06/1992, 12/31/1992   MMR 06/23/1993, 06/17/1996   Tdap 04/12/2004, 05/10/2018    -- flu vaccine encouraged getting -- TDAP q10 years up to date  -- Covid-19 Vaccine not up to date - encouraged getting   Encouraged regular vision and dental screening. Encouraged healthy exercise and diet.   Lesleigh Noe

## 2021-11-11 NOTE — Assessment & Plan Note (Signed)
Severe symptoms, patient's anxiety outside of flying is well-controlled on Zoloft and BuSpar.  He takes Xanax with good response, but has to fly at least 4 times per month.  Refill provided to reflect 6 months of flying.

## 2021-11-11 NOTE — Assessment & Plan Note (Signed)
Controlled, continue Zoloft.

## 2021-11-25 ENCOUNTER — Encounter: Payer: Self-pay | Admitting: Family Medicine

## 2021-11-25 DIAGNOSIS — R61 Generalized hyperhidrosis: Secondary | ICD-10-CM

## 2021-11-25 DIAGNOSIS — K5909 Other constipation: Secondary | ICD-10-CM

## 2021-11-25 NOTE — Telephone Encounter (Signed)
I spoke with pt; pt said for last 5 days has had dull lower mid abd pain that comes and goes. Pt said if standing does not have pain but if sits or lays has slight abd pain. Pt said has slight constipation; no hx of hemorrhoids. Last night at midnight pt had 2 constipated stools and did note bright red spotty blood on toilet tissue; pt did have lower abd discomfort in mid abd. No blood mixed with BM and no blood in toilet water. Pt said he has seen [facility] but he called them this morning and their first available appt was Feb 2024. Now pt is up moving around and no abd pain at this time. Offered pt an appt at [facility] but pt declined and request Dr Selena Batten to recommend GI specialist in [place].pt request cb after Dr Selena Batten reviews the note.UC & ED precautions given and pt voiced understanding. Sending note to Dr Selena Batten and [facility] CMA. Sorry evidently while I was typing this message pt was scheduled for appt with Dr Selena Batten for 11/26/21.

## 2021-11-25 NOTE — Telephone Encounter (Signed)
Called pt and scheduled him for 11/26/21 at 9:20.

## 2021-11-26 ENCOUNTER — Ambulatory Visit: Payer: BC Managed Care – PPO | Admitting: Family Medicine

## 2021-11-26 VITALS — BP 110/80 | HR 113 | Temp 97.7°F

## 2021-11-26 DIAGNOSIS — R1033 Periumbilical pain: Secondary | ICD-10-CM

## 2021-11-26 DIAGNOSIS — R42 Dizziness and giddiness: Secondary | ICD-10-CM | POA: Diagnosis not present

## 2021-11-26 DIAGNOSIS — K5909 Other constipation: Secondary | ICD-10-CM

## 2021-11-26 NOTE — Assessment & Plan Note (Signed)
Suspect this is secondary to pain and or bearing down with rectal pain.  Follows some possible dehydration.  Encouraged importance of drinking lots of water.  If symptoms do not improve by Monday we will get blood work including CMP, CBC, thyroid.

## 2021-11-26 NOTE — Assessment & Plan Note (Signed)
He notes a long history of on and off constipation.  Worse over the last 5 days.  Exam with increased rectal tone.  Discussed with this finding that pelvic floor physical therapy may be a good first step.  Urged him to try a laxative due to his abdominal pain and more significant symptoms.  In addition to Colace.  As he is feeling somewhat lightheaded, advise drinking at least 80 ounces of water/day over the next few days.

## 2021-11-26 NOTE — Assessment & Plan Note (Addendum)
Suspect this is secondary to constipation, treat constipation.  If symptoms do not improve we will get x-ray and/or CT scan on Monday.  Strict ER precautions if fever, worsening abdominal pain, or movement to the lower quadrant as patient does still have his appendix.

## 2021-11-26 NOTE — Patient Instructions (Addendum)
Drink 1 liter of water when you get home over the next 1-2 hours  If lightheadedness, abdominal pain, constipation are the same on Monday but not worse - we can do labs and imaging  If worsening over the weekend - go to the ER or Urgent care  #Referral I have placed a referral to a specialist for you. You should receive a phone call from the specialty office. Make sure your voicemail is not full and that if you are able to answer your phone to unknown or new numbers.   It may take up to 2 weeks to hear about the referral. If you do not hear anything in 2 weeks, please call our office and ask to speak with the referral coordinator.  - pelvic PT - GI  If not better - can check thyroid and consider imaging  Constipation   Constipation is a common issue. Often it is related to diet and occasionally medications.    What you can do to treat your symptoms 1) Fiber -- Eat more fiber rich foods: beans, broccoli, berries, avocados, popcorn, pear/apple, green peas, turnip greens, brussels sprouts, whole grains (barley, bran, quinoa, oatmeal) -- Take a Fiber supplement: Psyllium (Metamucil)  -- Could also eat Prunes daily  2) Hydration  -- Drink more water: Try to drink 80 oz of water per day  3) Exercise -- Moderate exercise (walking, jogging, biking) for 30 minutes, 5 days a week  4) Dedicate time for Bowel movements - do not delay  5) Stool Softener  - Docusate Sodium (Colace) 100 mg daily or twice daily as needed   Can do this now 6) Laxatives -- Polyethylene Glycol (Miralax) - begin with once daily. After a few days can increase to twice daily Or -- Magnesium Citrate -- Common side effect is nausea and diarrhea -- can try if still not improved  If still not improved after 4-6 weeks 7) Stimulant Laxatives: can also be an option if doing above treatment and no bowel movement for 3 days --- Bisacodyl (Dulcolax) 5-15 mg daily --- Sennosides (Senokot)  Treating chronic  constipation is often about finding the right amount of medication and fiber to keep you regular and comfortable. For some people that may be daily metamucil and colace every other day. For others it may be Metamucil and colace twice daily and Miralax 3 times a week. The goal is to go slow and listen to your body. And normal can be anywhere from 2-3 soft bowel movements a day to 1 bowel movement every 2-3 days.

## 2021-11-26 NOTE — Progress Notes (Signed)
Subjective:     Brent Faulkner is a 31 y.o. male presenting for Constipation (X 5 days. Now has blood when he wipes), Abdominal Pain (Lower center ), and Night Sweats (And lightheaded, Only last night)     Constipation This is a new problem. The stool is described as firm, blood tinged and pellet like. Associated symptoms include abdominal pain. Pertinent negatives include no anorexia, diarrhea, fever, nausea or vomiting. He has tried stool softeners (bread, banana diet) for the symptoms.  Abdominal Pain This is a new problem. The current episode started in the past 7 days. The pain is located in the periumbilical region and suprapubic region. The quality of the pain is cramping (bloating). The abdominal pain does not radiate. Associated symptoms include constipation. Pertinent negatives include no anorexia, diarrhea, dysuria, fever, frequency, hematuria, nausea or vomiting. Associated symptoms comments: Blood with wiping and painful bm.   Did have to strain prior to arrival and not sure if this is why he passed out - straining pain is worse than the belly pain  Hx of chronic constipation No hx of abdominal surgery  Hydration 3-4 bottles of water daily   Review of Systems  Constitutional:  Positive for appetite change (feels full) and chills. Negative for fever.  Gastrointestinal:  Positive for abdominal pain and constipation. Negative for anorexia, diarrhea, nausea and vomiting.  Genitourinary:  Negative for dysuria, frequency and hematuria.  Neurological:  Positive for light-headedness.     Social History   Tobacco Use  Smoking Status Former   Packs/day: 0.25   Years: 4.00   Total pack years: 1.00   Types: Cigarettes   Quit date: 04/05/2015   Years since quitting: 6.6  Smokeless Tobacco Never        Objective:    BP Readings from Last 3 Encounters:  11/26/21 110/80  11/11/21 100/60  06/25/20 130/62   Wt Readings from Last 3 Encounters:  11/11/21 123 lb 8 oz (56  kg)  08/13/20 125 lb (56.7 kg)  06/25/20 122 lb (55.3 kg)    BP 110/80   Pulse (!) 113   Temp 97.7 F (36.5 C) (Temporal)   SpO2 97%    Physical Exam Exam conducted with a chaperone present.  Constitutional:      Appearance: Normal appearance. He is not ill-appearing or diaphoretic.  HENT:     Head: Normocephalic and atraumatic.     Right Ear: External ear normal.     Left Ear: External ear normal.     Nose: Nose normal.  Eyes:     General: No scleral icterus.    Extraocular Movements: Extraocular movements intact.     Conjunctiva/sclera: Conjunctivae normal.  Cardiovascular:     Rate and Rhythm: Regular rhythm. Tachycardia present.     Heart sounds: No murmur heard. Pulmonary:     Effort: Pulmonary effort is normal. No respiratory distress.     Breath sounds: Normal breath sounds. No wheezing.  Abdominal:     General: Abdomen is flat. Bowel sounds are normal. There is no distension.     Palpations: Abdomen is soft. There is no mass.     Tenderness: There is abdominal tenderness in the periumbilical area and suprapubic area. There is no right CVA tenderness, left CVA tenderness or guarding. Negative signs include McBurney's sign.  Genitourinary:    Rectum: Tenderness present. No anal fissure, external hemorrhoid or internal hemorrhoid. Abnormal anal tone (increased rectal tone).  Musculoskeletal:     Cervical back: Neck supple.  Skin:    General: Skin is warm and dry.  Neurological:     Mental Status: He is alert. Mental status is at baseline.  Psychiatric:        Mood and Affect: Mood normal.           Assessment & Plan:   Problem List Items Addressed This Visit       Digestive   Chronic constipation - Primary    He notes a long history of on and off constipation.  Worse over the last 5 days.  Exam with increased rectal tone.  Discussed with this finding that pelvic floor physical therapy may be a good first step.  Urged him to try a laxative due to his  abdominal pain and more significant symptoms.  In addition to Colace.  As he is feeling somewhat lightheaded, advise drinking at least 80 ounces of water/day over the next few days.       Relevant Orders   Ambulatory referral to Physical Therapy   Ambulatory referral to Gastroenterology     Other   Lightheadedness    Suspect this is secondary to pain and or bearing down with rectal pain.  Follows some possible dehydration.  Encouraged importance of drinking lots of water.  If symptoms do not improve by Monday we will get blood work including CMP, CBC, thyroid.       Periumbilical abdominal pain    Suspect this is secondary to constipation, treat constipation.  If symptoms do not improve we will get x-ray and/or CT scan on Monday.  Strict ER precautions if fever, worsening abdominal pain, or movement to the lower quadrant as patient does still have his appendix.        Return if symptoms worsen or fail to improve.  [name], MD

## 2021-11-29 ENCOUNTER — Other Ambulatory Visit (INDEPENDENT_AMBULATORY_CARE_PROVIDER_SITE_OTHER): Payer: BC Managed Care – PPO

## 2021-11-29 DIAGNOSIS — R61 Generalized hyperhidrosis: Secondary | ICD-10-CM

## 2021-11-29 DIAGNOSIS — Z113 Encounter for screening for infections with a predominantly sexual mode of transmission: Secondary | ICD-10-CM | POA: Diagnosis not present

## 2021-11-29 DIAGNOSIS — Z1159 Encounter for screening for other viral diseases: Secondary | ICD-10-CM | POA: Diagnosis not present

## 2021-11-29 DIAGNOSIS — K5909 Other constipation: Secondary | ICD-10-CM | POA: Diagnosis not present

## 2021-11-29 LAB — CBC WITH DIFFERENTIAL/PLATELET
Basophils Absolute: 0 10*3/uL (ref 0.0–0.1)
Basophils Relative: 0.5 % (ref 0.0–3.0)
Eosinophils Absolute: 0 10*3/uL (ref 0.0–0.7)
Eosinophils Relative: 0.3 % (ref 0.0–5.0)
HCT: 45.9 % (ref 39.0–52.0)
Hemoglobin: 15.8 g/dL (ref 13.0–17.0)
Lymphocytes Relative: 17.6 % (ref 12.0–46.0)
Lymphs Abs: 1.5 10*3/uL (ref 0.7–4.0)
MCHC: 34.5 g/dL (ref 30.0–36.0)
MCV: 86.4 fl (ref 78.0–100.0)
Monocytes Absolute: 0.6 10*3/uL (ref 0.1–1.0)
Monocytes Relative: 6.4 % (ref 3.0–12.0)
Neutro Abs: 6.5 10*3/uL (ref 1.4–7.7)
Neutrophils Relative %: 75.2 % (ref 43.0–77.0)
Platelets: 244 10*3/uL (ref 150.0–400.0)
RBC: 5.32 Mil/uL (ref 4.22–5.81)
RDW: 12.6 % (ref 11.5–15.5)
WBC: 8.6 10*3/uL (ref 4.0–10.5)

## 2021-11-29 LAB — COMPREHENSIVE METABOLIC PANEL
ALT: 12 U/L (ref 0–53)
AST: 15 U/L (ref 0–37)
Albumin: 5 g/dL (ref 3.5–5.2)
Alkaline Phosphatase: 60 U/L (ref 39–117)
BUN: 9 mg/dL (ref 6–23)
CO2: 28 mEq/L (ref 19–32)
Calcium: 10 mg/dL (ref 8.4–10.5)
Chloride: 101 mEq/L (ref 96–112)
Creatinine, Ser: 1.07 mg/dL (ref 0.40–1.50)
GFR: 93.66 mL/min (ref >60.00)
Glucose, Bld: 107 mg/dL — ABNORMAL HIGH (ref 70–99)
Potassium: 4.7 mEq/L (ref 3.5–5.1)
Sodium: 138 mEq/L (ref 135–145)
Total Bilirubin: 0.5 mg/dL (ref 0.2–1.2)
Total Protein: 8 g/dL (ref 6.0–8.3)

## 2021-11-29 LAB — TSH: TSH: 2.06 u[IU]/mL (ref 0.35–5.50)

## 2021-11-30 LAB — HEPATITIS C ANTIBODY: Hepatitis C Ab: NONREACTIVE

## 2021-11-30 LAB — HIV ANTIBODY (ROUTINE TESTING W REFLEX): HIV 1&2 Ab, 4th Generation: NONREACTIVE

## 2022-03-11 ENCOUNTER — Telehealth: Payer: Self-pay | Admitting: Family Medicine

## 2022-03-11 ENCOUNTER — Encounter: Payer: Self-pay | Admitting: Nurse Practitioner

## 2022-03-11 DIAGNOSIS — F411 Generalized anxiety disorder: Secondary | ICD-10-CM

## 2022-03-11 DIAGNOSIS — F40243 Fear of flying: Secondary | ICD-10-CM

## 2022-03-11 MED ORDER — ALPRAZOLAM 0.25 MG PO TABS: 0.2500 mg | ORAL_TABLET | Freq: Two times a day (BID) | ORAL | 0 refills | Status: DC | PRN

## 2022-03-11 MED ORDER — BUSPIRONE HCL 5 MG PO TABS: 5.0000 mg | ORAL_TABLET | Freq: Two times a day (BID) | ORAL | 0 refills | Status: DC

## 2022-03-11 NOTE — Telephone Encounter (Signed)
  Encourage patient to contact the pharmacy for refills or they can request refills through [facility]  Did the patient contact the pharmacy: No  LAST APPOINTMENT DATE: 11/26/2021  NEXT APPOINTMENT DATE: 03/21/2022  MEDICATION: ALPRAZolam (XANAX) 0.25 MG tablet   busPIRone (BUSPAR) 5 MG tablet   Is the patient out of medication? Yes  PHARMACY:  [id number] - [place], [place] - [address]     Comment: Patient scheduled for TOC with [employer]. He will be leaving on aflight Monday and has anxiety.   Let patient know to contact pharmacy at the end of the day to make sure medication is ready.  Please notify patient to allow 48-72 hours to process

## 2022-03-11 NOTE — Telephone Encounter (Signed)
Responded via mychart. Script sent in

## 2022-03-14 DIAGNOSIS — U071 COVID-19: Secondary | ICD-10-CM | POA: Diagnosis not present

## 2022-03-14 DIAGNOSIS — R509 Fever, unspecified: Secondary | ICD-10-CM | POA: Diagnosis not present

## 2022-03-14 DIAGNOSIS — J029 Acute pharyngitis, unspecified: Secondary | ICD-10-CM | POA: Diagnosis not present

## 2022-03-21 ENCOUNTER — Encounter: Payer: BC Managed Care – PPO | Admitting: Nurse Practitioner

## 2022-05-09 ENCOUNTER — Ambulatory Visit: Payer: BC Managed Care – PPO | Admitting: Nurse Practitioner

## 2022-05-09 ENCOUNTER — Encounter: Payer: Self-pay | Admitting: Nurse Practitioner

## 2022-05-09 VITALS — BP 106/66 | HR 85 | Temp 97.8°F | Resp 16 | Ht 67.25 in | Wt 137.4 lb

## 2022-05-09 DIAGNOSIS — F411 Generalized anxiety disorder: Secondary | ICD-10-CM

## 2022-05-09 DIAGNOSIS — F325 Major depressive disorder, single episode, in full remission: Secondary | ICD-10-CM

## 2022-05-09 DIAGNOSIS — F40243 Fear of flying: Secondary | ICD-10-CM | POA: Diagnosis not present

## 2022-05-09 MED ORDER — ALPRAZOLAM 0.25 MG PO TABS: 0.2500 mg | ORAL_TABLET | Freq: Two times a day (BID) | ORAL | 0 refills | Status: DC | PRN

## 2022-05-09 NOTE — Progress Notes (Signed)
Established Patient Office Visit  Subjective   Patient ID: Brent Faulkner, male    DOB: 1990-06-21  Age: 32 y.o. MRN: [id number]  Chief Complaint  Patient presents with   Establish Care    Transfer of care    HPI  Transfer of care: last seen by Waunita Schooner, MD on 11/26/2021 Last CPE on 11/11/2021  Anxiety with flying: States that he does have flying anxiety has tried many things states that he flies often. States that he will likely have 2 flights a month  GAD/MDD: States that he has been off buspar and zoloft since October. States that he is going to the gym daily now. States that he is going out with friends being more social. States that he is also going to church. Stopped eating fast food States that a couple nights he will have trouble. States that he can take 3 mg of melatonin a night and go to sleep  Goes to bed around 10 and wakes up around 630. Feels rested and does not snore  Constipation:BM daily. States that sometimes he will take a colace. States the last time was two weeks. States that he will drink water  Tdap: 2020 Flu: 03/2022 Covid vaccines: J&J       Review of Systems  Constitutional:  Negative for chills and fever.  Respiratory:  Negative for shortness of breath.   Cardiovascular:  Negative for chest pain and leg swelling.  Gastrointestinal:  Negative for abdominal pain, blood in stool, constipation, diarrhea, nausea and vomiting.       Bm daily   Genitourinary:  Negative for dysuria and hematuria.  Neurological:  Negative for tingling and headaches.  Psychiatric/Behavioral:  Negative for hallucinations and suicidal ideas.       Objective:     BP 106/66   Pulse 85   Temp 97.8 F (36.6 C)   Resp 16   Ht 5' 7.25" (1.708 m)   Wt 137 lb 6 oz (62.3 kg)   SpO2 100%   BMI 21.36 kg/m    Physical Exam Vitals and nursing note reviewed.  Constitutional:      Appearance: Normal appearance.  HENT:     Right Ear: Tympanic membrane, ear  canal and external ear normal.     Left Ear: Tympanic membrane, ear canal and external ear normal.     Mouth/Throat:     Mouth: Mucous membranes are moist.     Pharynx: Oropharynx is clear.  Eyes:     Extraocular Movements: Extraocular movements intact.     Pupils: Pupils are equal, round, and reactive to light.  Cardiovascular:     Rate and Rhythm: Normal rate and regular rhythm.     Pulses: Normal pulses.     Heart sounds: Normal heart sounds.  Pulmonary:     Effort: Pulmonary effort is normal.     Breath sounds: Normal breath sounds.  Musculoskeletal:     Right lower leg: No edema.     Left lower leg: No edema.  Lymphadenopathy:     Cervical: No cervical adenopathy.  Skin:    General: Skin is warm.  Neurological:     General: No focal deficit present.     Mental Status: He is alert.     Deep Tendon Reflexes:     Reflex Scores:      Bicep reflexes are 2+ on the right side and 2+ on the left side.      Patellar reflexes are 2+ on the right side  and 2+ on the left side.    Comments: Bilateral upper and lower extremity strength 5/5  Psychiatric:        Mood and Affect: Mood normal.        Behavior: Behavior normal.        Thought Content: Thought content normal.        Judgment: Judgment normal.      No results found for any visits on 05/09/22.    The ASCVD Risk score (Arnett DK, et al., 2019) failed to calculate for the following reasons:   The 2019 ASCVD risk score is only valid for ages 56 to 70    Assessment & Plan:   Problem List Items Addressed This Visit       Other   Generalized anxiety disorder - Primary    States that he has stopped using the buspirone.  States he is made lifestyle modifications in regards to cleaning of his eating and exercising more often.  Patient denies HI/SI/AVH.  Will discontinue buspirone at this juncture and hold on any pharmacological therapy      Relevant Medications   ALPRAZolam (XANAX) 0.25 MG tablet   Major depressive  disorder    Patient stopped taking sertraline.  He denies HI/SI/AVH.  States he did change lifestyle start exercising more and has been more social and feels the best he has in the past 5 to 6 years.  We will continue his lifestyle modifications and hold any medication at this current juncture      Relevant Medications   ALPRAZolam (XANAX) 0.25 MG tablet   Anxiety with flying    Patient will use alprazolam as needed for flying.  States that the 60 tablets will last him approximately a year.  Refill provided today      Relevant Medications   ALPRAZolam (XANAX) 0.25 MG tablet    Return in about 27 weeks (around 11/14/2022) for CPE and Labs.    Romilda Garret, NP

## 2022-05-09 NOTE — Assessment & Plan Note (Signed)
Patient will use alprazolam as needed for flying.  States that the 60 tablets will last him approximately a year.  Refill provided today

## 2022-05-09 NOTE — Patient Instructions (Signed)
Nice to see you today I have refilled your alprazolam I will see you in approx 6 months for your physicals and labs, sooner if you need me

## 2022-05-09 NOTE — Assessment & Plan Note (Signed)
States that he has stopped using the buspirone.  States he is made lifestyle modifications in regards to cleaning of his eating and exercising more often.  Patient denies HI/SI/AVH.  Will discontinue buspirone at this juncture and hold on any pharmacological therapy

## 2022-05-09 NOTE — Assessment & Plan Note (Signed)
Patient stopped taking sertraline.  He denies HI/SI/AVH.  States he did change lifestyle start exercising more and has been more social and feels the best he has in the past 5 to 6 years.  We will continue his lifestyle modifications and hold any medication at this current juncture

## 2022-08-07 IMAGING — CT CT ABD-PELV W/ CM
2 of 4 series · 16 of 46 positions shown, 18 images · IV contrast (APPLIED)
Comparison: None.

CLINICAL DATA: Right lower quadrant abdominal pain. Clinical
concern for appendicitis.

EXAM:
CT ABDOMEN AND PELVIS WITH CONTRAST
TECHNIQUE: Multidetector CT imaging of the abdomen and pelvis was performed
using the standard protocol following bolus administration of
intravenous contrast.
CONTRAST:  75mL OMNIPAQUE IOHEXOL 300 MG/ML  SOLN

[Series 2: routine abd/pel with · axial · 0.64mm/px · z∈[-1139,-744]mm · 13 of 87 slices shown, 15 images]
[im 4/87  soft-tissue]
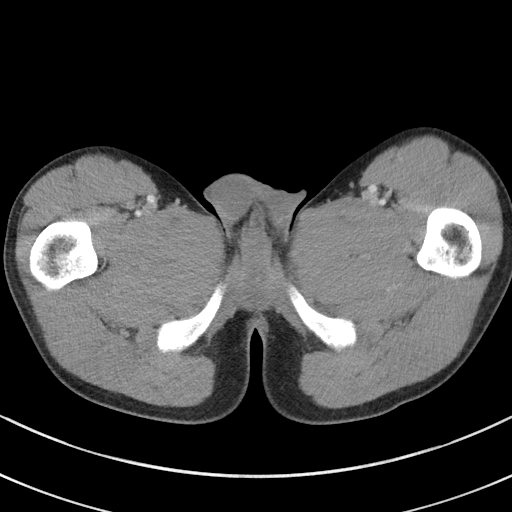
[im 4/87  bone]
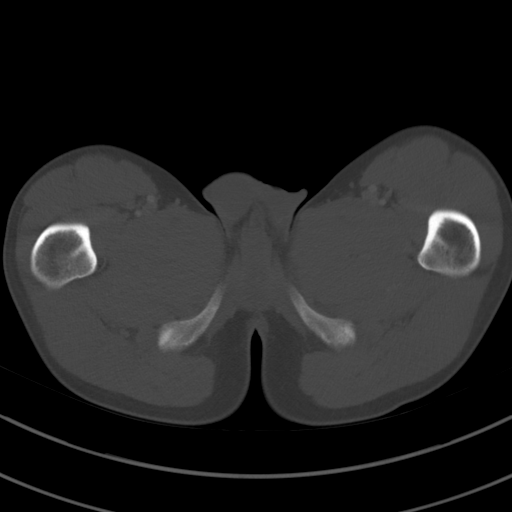
[im 12/87  soft-tissue]
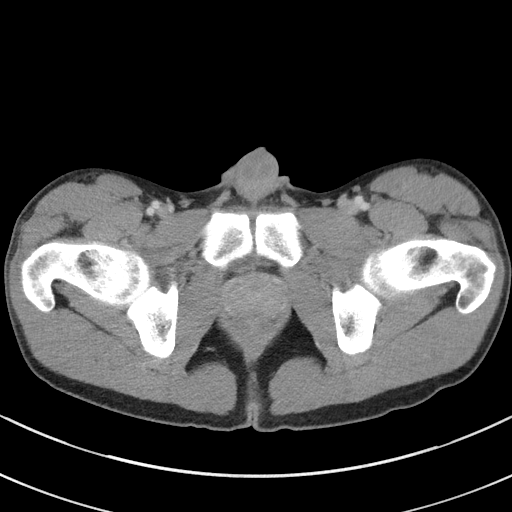
[im 19/87  soft-tissue]
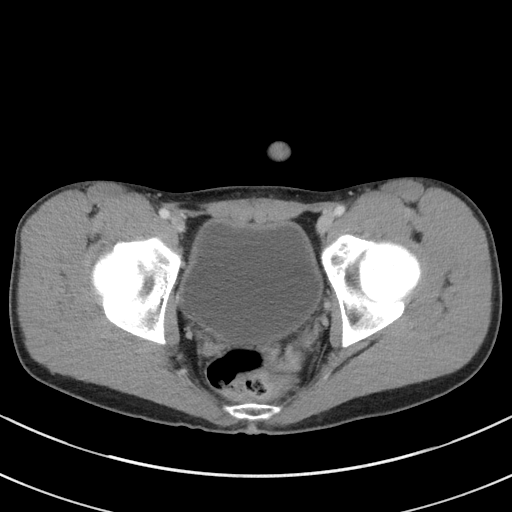
[im 23/87  soft-tissue]
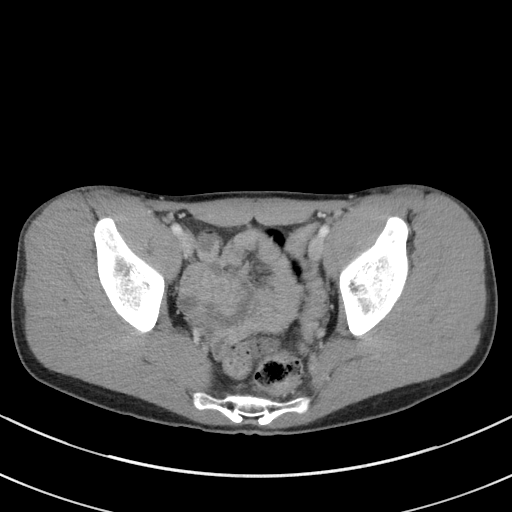
[im 30/87  soft-tissue]
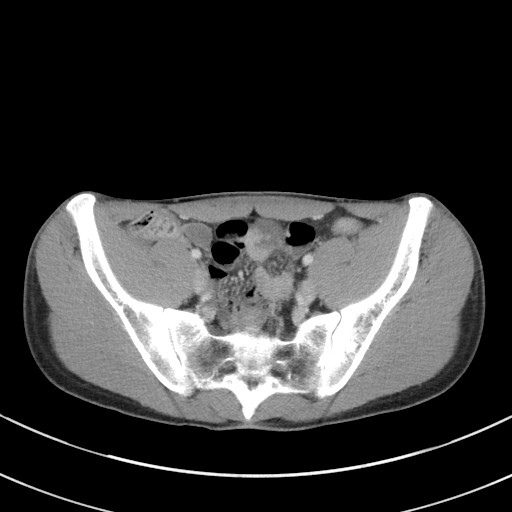
[im 38/87  soft-tissue]
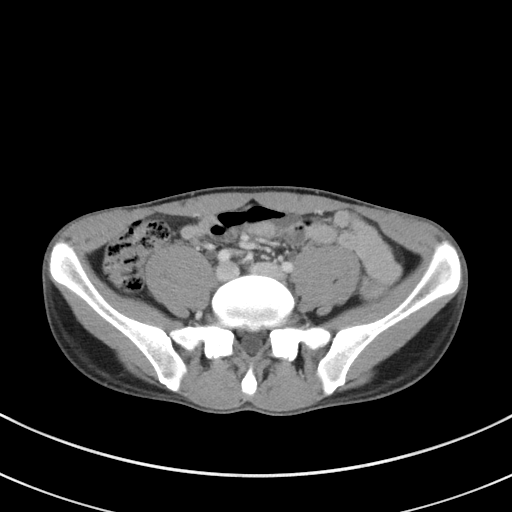
[im 45/87  soft-tissue]
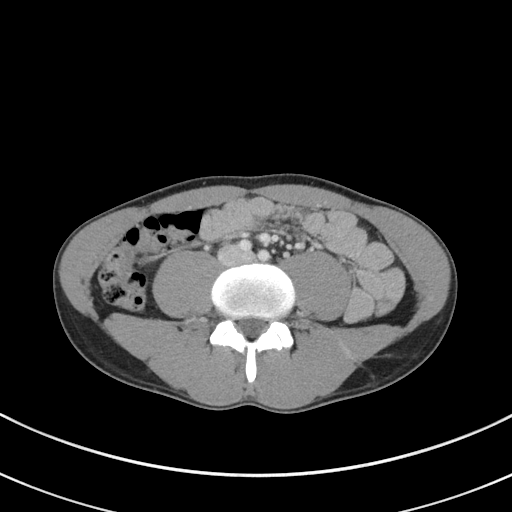
[im 49/87  soft-tissue]
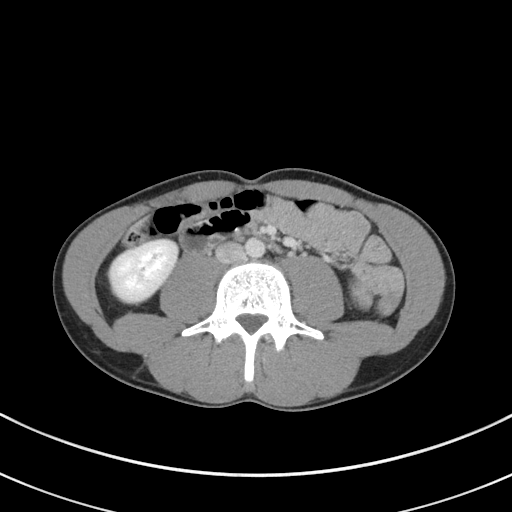
[im 57/87  soft-tissue]
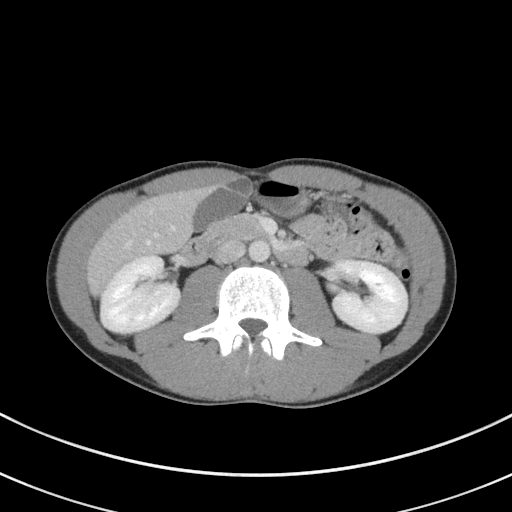
[im 57/87  bone]
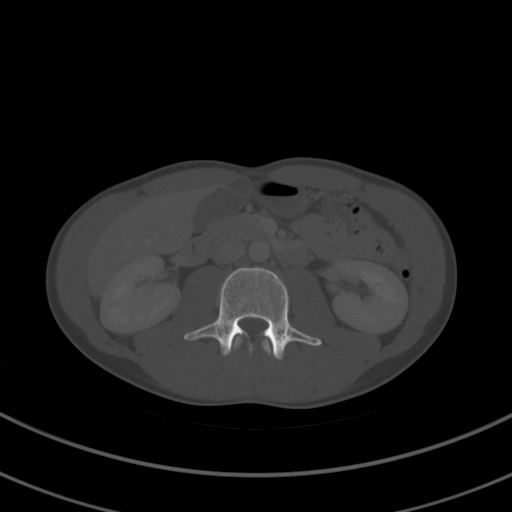
[im 64/87  soft-tissue]
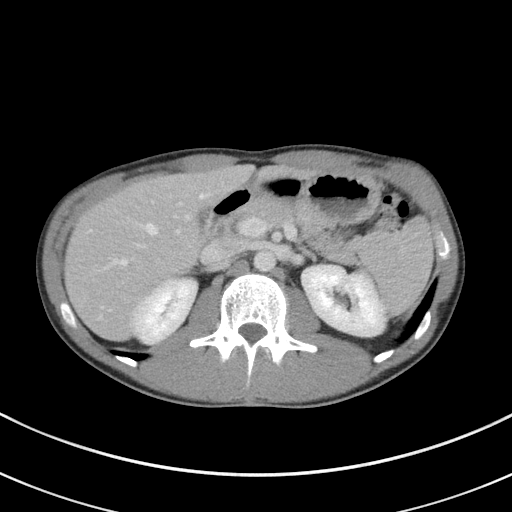
[im 68/87  soft-tissue]
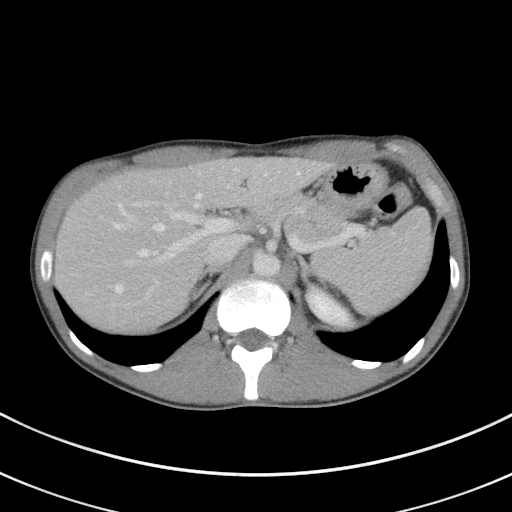
[im 75/87  soft-tissue]
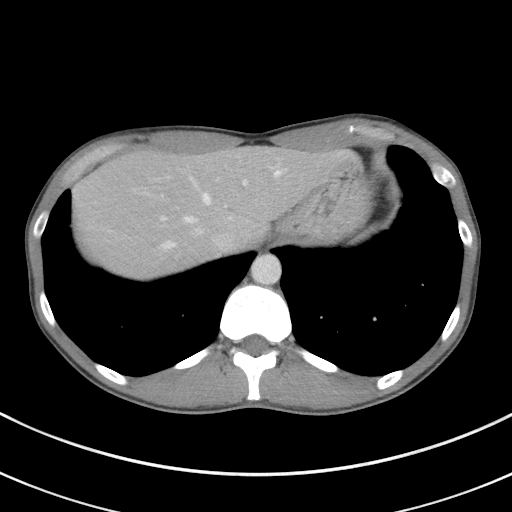
[im 83/87  soft-tissue]
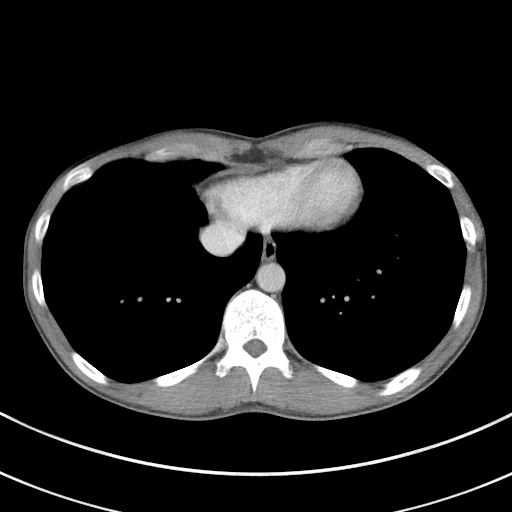

[Series 5: coronal st · coronal · 0.64mm/px · 3 of 68 slices shown]
[im 23/68  soft-tissue]
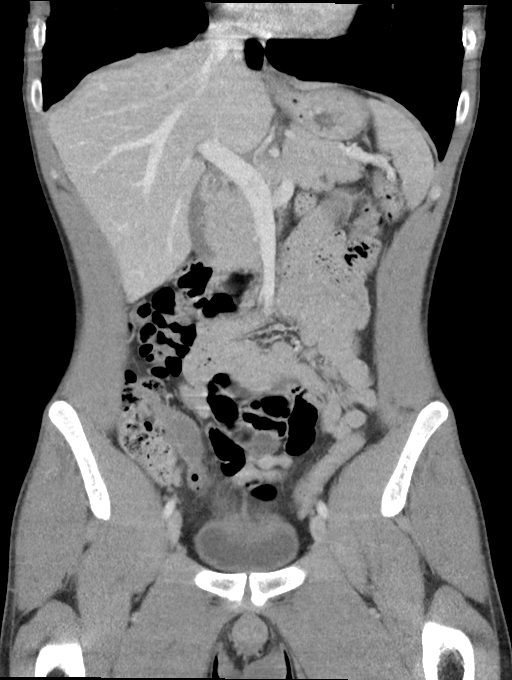
[im 30/68  soft-tissue]
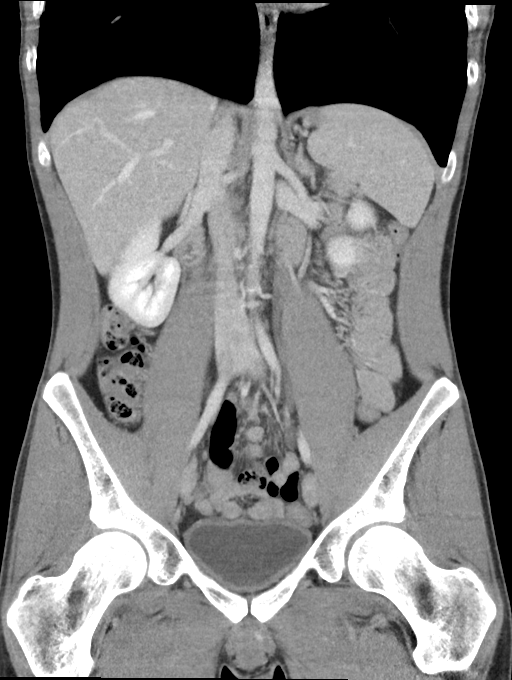
[im 38/68  soft-tissue]
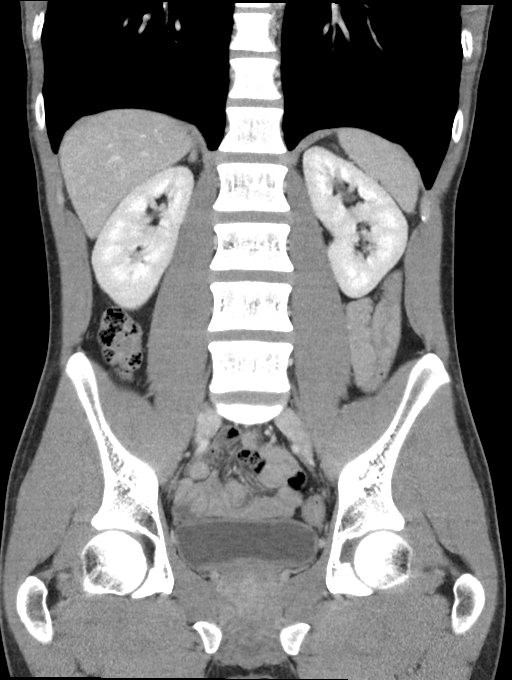

[16 of 46 positions shown; findings below may reference images not displayed]

FINDINGS: Lower chest: Clear lung bases.  Heart normal in size.

Hepatobiliary: No focal liver abnormality is seen. No gallstones,
gallbladder wall thickening, or biliary dilatation.

Pancreas: Unremarkable. No pancreatic ductal dilatation or
surrounding inflammatory changes.

Spleen: Normal in size without focal abnormality.

Adrenals/Urinary Tract: Adrenal glands are unremarkable. Kidneys are
normal, without renal calculi, focal lesion, or hydronephrosis.
Bladder is unremarkable.

Stomach/Bowel: No evidence of appendicitis. A portion of a normal
sized appendix is suggested immediately inferior to the cecal tip.

Normal stomach, small bowel and colon.

Vascular/Lymphatic: No significant vascular findings are present. No
enlarged abdominal or pelvic lymph nodes.

Reproductive: Unremarkable.

Other: No abdominal wall hernia or abnormality. No abdominopelvic
ascites.

Musculoskeletal: No acute or significant osseous findings.
IMPRESSION: 1. Normal exam.  No evidence of appendicitis.

## 2022-11-14 ENCOUNTER — Other Ambulatory Visit: Payer: Self-pay | Admitting: Nurse Practitioner

## 2022-11-14 ENCOUNTER — Ambulatory Visit (INDEPENDENT_AMBULATORY_CARE_PROVIDER_SITE_OTHER): Payer: Self-pay | Admitting: Nurse Practitioner

## 2022-11-14 ENCOUNTER — Encounter: Payer: Self-pay | Admitting: Nurse Practitioner

## 2022-11-14 VITALS — BP 120/88 | HR 75 | Temp 99.2°F | Ht 68.0 in | Wt 139.8 lb

## 2022-11-14 DIAGNOSIS — F40243 Fear of flying: Secondary | ICD-10-CM

## 2022-11-14 DIAGNOSIS — F411 Generalized anxiety disorder: Secondary | ICD-10-CM

## 2022-11-14 DIAGNOSIS — Z0000 Encounter for general adult medical examination without abnormal findings: Secondary | ICD-10-CM | POA: Insufficient documentation

## 2022-11-14 LAB — COMPREHENSIVE METABOLIC PANEL
ALT: 36 U/L (ref 0–53)
AST: 29 U/L (ref 0–37)
Albumin: 4.7 g/dL (ref 3.5–5.2)
Alkaline Phosphatase: 73 U/L (ref 39–117)
BUN: 12 mg/dL (ref 6–23)
CO2: 28 mEq/L (ref 19–32)
Calcium: 9.4 mg/dL (ref 8.4–10.5)
Chloride: 101 mEq/L (ref 96–112)
Creatinine, Ser: 1.06 mg/dL (ref 0.40–1.50)
GFR: 94.09 mL/min (ref >60.00)
Glucose, Bld: 103 mg/dL — ABNORMAL HIGH (ref 70–99)
Potassium: 4.2 mEq/L (ref 3.5–5.1)
Sodium: 137 mEq/L (ref 135–145)
Total Bilirubin: 0.3 mg/dL (ref 0.2–1.2)
Total Protein: 7.6 g/dL (ref 6.0–8.3)

## 2022-11-14 LAB — TSH: TSH: 2.34 u[IU]/mL (ref 0.35–5.50)

## 2022-11-14 LAB — CBC
HCT: 46.1 % (ref 39.0–52.0)
Hemoglobin: 15.3 g/dL (ref 13.0–17.0)
MCHC: 33.2 g/dL (ref 30.0–36.0)
MCV: 89.3 fl (ref 78.0–100.0)
Platelets: 225 10*3/uL (ref 150.0–400.0)
RBC: 5.16 Mil/uL (ref 4.22–5.81)
RDW: 12.6 % (ref 11.5–15.5)
WBC: 5.7 10*3/uL (ref 4.0–10.5)

## 2022-11-14 MED ORDER — ALPRAZOLAM 0.25 MG PO TABS: 0.2500 mg | ORAL_TABLET | Freq: Two times a day (BID) | ORAL | 0 refills | Status: DC | PRN

## 2022-11-14 NOTE — Assessment & Plan Note (Signed)
History of same uses alprazolam as needed.  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

## 2022-11-14 NOTE — Assessment & Plan Note (Signed)
Discussed age-appropriate immunizations and screening exams.  Did review patient's personal, surgical, social, family histories.  Patient up-to-date on all age-appropriate vaccinations.  Patient is too young for CRC screening or prostate cancer screening.  Patient was given information at discharge about preventative healthcare maintenance with anticipatory guidance.  Patient has no concern for STIs and would not like to be screened today.

## 2022-11-14 NOTE — Patient Instructions (Signed)
Nice to see you today I will be in touch with the labs once I have them Follow up with me in 1 year for your next physical and labs, sooner if you need me 

## 2022-11-14 NOTE — Progress Notes (Signed)
Established Patient Office Visit  Subjective   Patient ID: Brent Faulkner, male    DOB: 1990/10/29  Age: 32 y.o. MRN: [id number]  Chief Complaint  Patient presents with   Annual Exam    CPE/Labs. Pt has no questions or concerns.     HPI  for complete physical and follow up of chronic conditions.  Anxiety with flying: Patient takes alprazolam as needed.  Patient flies approximately 2-3 times a month with employer   Immunizations: -Tetanus: Completed in 2020 -Influenza: Completed this season -Shingles: Too young -Pneumonia: Too young  Diet: Fair diet. 3 meals a day with late night snack. States that he drinks water Exercise: . Lifts weights daily and sometimes cardio 1.5 hours at a time  Eye exam: Completes annually . Contacts  Dental exam: Completes semi-annually    Colonoscopy: Too young, currently average risk Lung Cancer Screening: N/A  PSA: Too young, currently average risk  Sleep: goes to bed around 930 and gets up around 7. Feels rested. Snore intermittently  Sti: no concerns and does not want to be tested      Review of Systems  Constitutional:  Negative for chills and fever.  Respiratory:  Negative for shortness of breath.   Cardiovascular:  Negative for chest pain and leg swelling.  Gastrointestinal:  Negative for abdominal pain, blood in stool, constipation, diarrhea, nausea and vomiting.       BM daily   Genitourinary:  Negative for dysuria and hematuria.  Neurological:  Negative for tingling and headaches.  Psychiatric/Behavioral:  Negative for hallucinations and suicidal ideas.       Objective:     BP 120/88   Pulse 75   Temp 99.2 F (37.3 C) (Temporal)   Ht 5\' 8"  (1.727 m)   Wt 139 lb 12.8 oz (63.4 kg)   SpO2 98%   BMI 21.26 kg/m  BP Readings from Last 3 Encounters:  11/14/22 120/88  05/09/22 106/66  11/26/21 110/80   Wt Readings from Last 3 Encounters:  11/14/22 139 lb 12.8 oz (63.4 kg)  05/09/22 137 lb 6 oz (62.3 kg)   11/11/21 123 lb 8 oz (56 kg)      Physical Exam Vitals and nursing note reviewed. Exam conducted with a chaperone present Melina Copa, CMA).  Constitutional:      Appearance: Normal appearance.  HENT:     Right Ear: Tympanic membrane, ear canal and external ear normal.     Left Ear: Tympanic membrane, ear canal and external ear normal.     Mouth/Throat:     Mouth: Mucous membranes are moist.     Pharynx: Oropharynx is clear.  Eyes:     Extraocular Movements: Extraocular movements intact.     Pupils: Pupils are equal, round, and reactive to light.  Cardiovascular:     Rate and Rhythm: Normal rate and regular rhythm.     Pulses: Normal pulses.     Heart sounds: Normal heart sounds.  Pulmonary:     Effort: Pulmonary effort is normal.     Breath sounds: Normal breath sounds.  Abdominal:     General: Bowel sounds are normal. There is no distension.     Palpations: There is no mass.     Tenderness: There is no abdominal tenderness.     Hernia: No hernia is present. There is no hernia in the left inguinal area or right inguinal area.  Genitourinary:    Penis: Normal.      Testes: Normal.  Epididymis:     Right: Normal.     Left: Normal.  Musculoskeletal:     Right lower leg: No edema.     Left lower leg: No edema.  Lymphadenopathy:     Cervical: No cervical adenopathy.     Lower Body: No right inguinal adenopathy. No left inguinal adenopathy.  Skin:    General: Skin is warm.  Neurological:     General: No focal deficit present.     Mental Status: He is alert.     Deep Tendon Reflexes:     Reflex Scores:      Bicep reflexes are 2+ on the right side and 2+ on the left side.      Patellar reflexes are 2+ on the right side and 2+ on the left side.    Comments: Bilateral upper and lower extremity strength 5/5  Psychiatric:        Mood and Affect: Mood normal.        Behavior: Behavior normal.        Thought Content: Thought content normal.        Judgment: Judgment  normal.      No results found for any visits on 11/14/22.    The ASCVD Risk score (Arnett DK, et al., 2019) failed to calculate for the following reasons:   The 2019 ASCVD risk score is only valid for ages 46 to 43    Assessment & Plan:   Problem List Items Addressed This Visit       Other   Anxiety with flying    History of same uses alprazolam as needed.  Patient flies approximately 2-3 times a month with his employer      Preventative health care - Primary    Discussed age-appropriate immunizations and screening exams.  Did review patient's personal, surgical, social, family histories.  Patient up-to-date on all age-appropriate vaccinations.  Patient is too young for CRC screening or prostate cancer screening.  Patient was given information at discharge about preventative healthcare maintenance with anticipatory guidance.  Patient has no concern for STIs and would not like to be screened today.      Relevant Orders   CBC   Comprehensive metabolic panel   TSH    Return in about 1 year (around 11/14/2023) for CPE and Labs.    Audria Nine, NP

## 2023-05-05 ENCOUNTER — Other Ambulatory Visit: Payer: Self-pay | Admitting: Nurse Practitioner

## 2023-05-05 DIAGNOSIS — F40243 Fear of flying: Secondary | ICD-10-CM

## 2023-05-05 DIAGNOSIS — F411 Generalized anxiety disorder: Secondary | ICD-10-CM

## 2023-05-09 MED ORDER — ALPRAZOLAM 0.25 MG PO TABS: 0.2500 mg | ORAL_TABLET | Freq: Two times a day (BID) | ORAL | 0 refills | Status: DC | PRN

## 2023-10-25 ENCOUNTER — Encounter: Payer: Self-pay | Admitting: Nurse Practitioner

## 2023-10-25 DIAGNOSIS — F40243 Fear of flying: Secondary | ICD-10-CM

## 2023-10-25 DIAGNOSIS — F411 Generalized anxiety disorder: Secondary | ICD-10-CM

## 2023-10-25 MED ORDER — ALPRAZOLAM 0.25 MG PO TABS: 0.2500 mg | ORAL_TABLET | Freq: Two times a day (BID) | ORAL | 0 refills | Status: AC | PRN

## 2023-12-04 ENCOUNTER — Ambulatory Visit
Admission: EM | Admit: 2023-12-04 | Discharge: 2023-12-04 | Disposition: A | Discharge status: Home / Self Care | Payer: Self-pay | Attending: Emergency Medicine | Admitting: Emergency Medicine

## 2023-12-04 DIAGNOSIS — H5711 Ocular pain, right eye: Secondary | ICD-10-CM

## 2023-12-04 DIAGNOSIS — H1031 Unspecified acute conjunctivitis, right eye: Secondary | ICD-10-CM

## 2023-12-04 MED ORDER — POLYMYXIN B-TRIMETHOPRIM 10000-0.1 UNIT/ML-% OP SOLN: 1.0000 [drp] | Freq: Four times a day (QID) | OPHTHALMIC | 0 refills | Status: AC

## 2023-12-04 NOTE — Discharge Instructions (Addendum)
Use the antibiotic eyedrops as prescribed.    Follow-up with your eye care provider tomorrow.    Go to the emergency department if you have acute eye pain, changes in your vision, or other concerning symptoms.

## 2023-12-04 NOTE — ED Triage Notes (Signed)
 Pt states that he has redness, drainage and pain of his right eye. X1 day

## 2023-12-04 NOTE — ED Provider Notes (Signed)
 Brent Faulkner    CSN: [id number] Arrival date & time: 12/04/23  1301      History   Chief Complaint Chief Complaint  Patient presents with   Eye Problem    HPI Brent Faulkner is a 33 y.o. male.  Patient presents with right eye discomfort, redness, tearing since yesterday.  His symptoms started after he took out his contact lenses yesterday.  No trauma known.  No change in vision, purulent drainage, fever.  Treatment attempted at home with lubricating eyedrops.  Patient is not currently wearing his contact lenses.  The history is provided by the patient and medical records.    Past Medical History:  Diagnosis Date   Anxiety    Depression    History of multiple concussions     Patient Active Problem List   Diagnosis Date Noted   Preventative health care 11/14/2022   Chronic constipation 11/26/2021   Lightheadedness 11/26/2021   Periumbilical abdominal pain 11/26/2021   Anxiety with flying 06/25/2020   Generalized anxiety disorder 03/18/2019   Major depressive disorder 03/18/2019   Shoulder pain, left 11/16/2018    Past Surgical History:  Procedure Laterality Date   DENTAL SURGERY     NO PAST SURGERIES         Home Medications    Prior to Admission medications   Medication Sig Start Date End Date Taking? Authorizing Provider  trimethoprim -polymyxin b  (POLYTRIM ) ophthalmic solution Place 1 drop into the right eye 4 (four) times daily for 7 days. 12/04/23 12/11/23 Yes Corlis Burnard DEL, NP  ALPRAZolam  (XANAX ) 0.25 MG tablet Take 1 tablet (0.25 mg total) by mouth 2 (two) times daily as needed for anxiety. 10/25/23   Wendee Lynwood HERO, NP  Multiple Vitamins-Minerals (AIRBORNE PO) Take by mouth daily.    [provider]    Family History Family History  Problem Relation Age of Onset   Addison's disease Mother    Healthy Father    Healthy Sister    Diabetes Brother        Type 1   Breast cancer Maternal Grandmother    Heart attack Maternal Grandfather  57   Lung cancer Paternal Grandfather        tobacco use    Social History Social History   Tobacco Use   Smoking status: Former    Current packs/day: 0.00    Average packs/day: 0.3 packs/day for 4.0 years (1.0 ttl pk-yrs)    Types: Cigarettes    Start date: 04/05/2011    Quit date: 04/05/2015    Years since quitting: 8.6   Smokeless tobacco: Never  Vaping Use   Vaping status: Former   Start date: 05/06/2015   Substances: Nicotine, Flavoring  Substance Use Topics   Alcohol use: Never    Alcohol/week: 1.0 standard drink of alcohol    Types: 1 Cans of beer per week    Comment: 1-2 drinks a week. States red wine   Drug use: No     Allergies   Patient has no known allergies.   Review of Systems Review of Systems  Constitutional:  Negative for chills and fever.  HENT:  Positive for rhinorrhea. Negative for ear pain and sore throat.   Eyes:  Positive for photophobia, pain, discharge and redness. Negative for itching and visual disturbance.  Respiratory:  Negative for cough and shortness of breath.      Physical Exam Triage Vital Signs ED Triage Vitals  Encounter Vitals Group     BP 12/04/23 1318 (!)  142/78     Girls Systolic BP Percentile --      Girls Diastolic BP Percentile --      Boys Systolic BP Percentile --      Boys Diastolic BP Percentile --      Pulse Rate 12/04/23 1318 96     Resp 12/04/23 1318 18     Temp 12/04/23 1318 98 F (36.7 C)     Temp Source 12/04/23 1318 Temporal     SpO2 12/04/23 1318 99 %     Weight 12/04/23 1316 155 lb (70.3 kg)     Height 12/04/23 1316 5' 9 (1.753 m)     Head Circumference --      Peak Flow --      Pain Score 12/04/23 1316 7     Pain Loc --      Pain Education --      Exclude from Growth Chart --    No data found.  Updated Vital Signs BP (!) 142/78 (BP Location: Left Arm)   Pulse 96   Temp 98 F (36.7 C) (Temporal)   Resp 18   Ht 5' 9 (1.753 m)   Wt 155 lb (70.3 kg)   SpO2 99%   BMI 22.89 kg/m   Visual  Acuity Right Eye Distance:   Left Eye Distance:   Bilateral Distance:    Right Eye Near: R Near: 20/40 Left Eye Near:  L Near: 20/40 Bilateral Near:     Physical Exam Constitutional:      General: He is not in acute distress. HENT:     Nose: Rhinorrhea present.     Mouth/Throat:     Mouth: Mucous membranes are moist.  Eyes:     General: Lids are normal. Vision grossly intact.     Extraocular Movements: Extraocular movements intact.     Conjunctiva/sclera:     Right eye: Right conjunctiva is injected.     Left eye: Left conjunctiva is not injected.     Pupils: Pupils are equal, round, and reactive to light.     Right eye: No fluorescein uptake.     Comments: Right eye tearing.  No purulent drainage.  No foreign body or fluorescein uptake noted.  Cardiovascular:     Rate and Rhythm: Normal rate and regular rhythm.  Pulmonary:     Effort: Pulmonary effort is normal. No respiratory distress.  Neurological:     Mental Status: He is alert.      UC Treatments / Results  Labs (all labs ordered are listed, but only abnormal results are displayed) Labs Reviewed - No data to display  EKG   Radiology No results found.  Procedures Procedures (including critical care time)  Medications Ordered in UC Medications - No data to display  Initial Impression / Assessment and Plan / UC Course  I have reviewed the triage vital signs and the nursing notes.  Pertinent labs & imaging results that were available during my care of the patient were reviewed by me and considered in my medical decision making (see chart for details).    Unspecified acute conjunctivitis of right eye with superficial discomfort.  Patient reports no pain within the structure of his eye.  No trauma.  On exam today, there is no fluorescein uptake or foreign body noted.  Treating prophylactically with Polytrim  eyedrops.  Instructed patient to follow-up with his eye care provider tomorrow.  ED precautions  discussed.  He agrees to plan of care.  Final Clinical Impressions(s) /  UC Diagnoses   Final diagnoses:  Acute conjunctivitis of right eye, unspecified acute conjunctivitis type  Pain in right eye     Discharge Instructions      Use the antibiotic eyedrops as prescribed.    Follow-up with your eye care provider tomorrow.    Go to the emergency department if you have acute eye pain, changes in your vision, or other concerning symptoms.        ED Prescriptions     Medication Sig Dispense Auth. Provider   trimethoprim -polymyxin b  (POLYTRIM ) ophthalmic solution Place 1 drop into the right eye 4 (four) times daily for 7 days. 10 mL Corlis Burnard DEL, NP      PDMP not reviewed this encounter.   Corlis Burnard DEL, NP 12/04/23 1349

## 2024-02-09 ENCOUNTER — Encounter: Payer: Self-pay | Admitting: Nurse Practitioner

## 2024-02-09 ENCOUNTER — Other Ambulatory Visit: Payer: Self-pay | Admitting: Medical Genetics

## 2024-02-12 ENCOUNTER — Ambulatory Visit: Payer: Self-pay

## 2024-02-19 ENCOUNTER — Ambulatory Visit (INDEPENDENT_AMBULATORY_CARE_PROVIDER_SITE_OTHER): Payer: Self-pay

## 2024-02-19 VITALS — BP 124/86 | HR 98 | Temp 99.0°F | Ht 68.0 in | Wt 165.0 lb

## 2024-02-19 DIAGNOSIS — K219 Gastro-esophageal reflux disease without esophagitis: Secondary | ICD-10-CM

## 2024-02-19 DIAGNOSIS — H66012 Acute suppurative otitis media with spontaneous rupture of ear drum, left ear: Secondary | ICD-10-CM

## 2024-02-19 MED ORDER — AMOXICILLIN-POT CLAVULANATE 875-125 MG PO TABS: 1.0000 | ORAL_TABLET | Freq: Two times a day (BID) | ORAL | 0 refills | Status: AC

## 2024-02-19 MED ORDER — OMEPRAZOLE 40 MG PO CPDR: 40.0000 mg | DELAYED_RELEASE_CAPSULE | Freq: Every day | ORAL | 0 refills | Status: AC

## 2024-02-19 NOTE — Patient Instructions (Addendum)
 Thank you for visiting [facility] today! Here's what we talked about: - START Omeprazole and Augmentin  - Reduce tomatoes/Tomatoe based sauces

## 2024-02-19 NOTE — Progress Notes (Signed)
 Subjective:   This visit was conducted in person. The patient gave informed consent to the use of Abridge AI technology to record the contents of the encounter as documented below.   Patient ID: Brent Faulkner, male    DOB: 10/31/1990, 33 y.o.   MRN: [id number]   Discussed the use of AI scribe software for clinical note transcription with the patient, who gave verbal consent to proceed.  History of Present Illness Brent Faulkner is a 33 year old male who presents with burning throat sensation and ear discharge.  He experiences a burning sensation in his throat about an hour after eating, which started approximately two months ago. This occurs most times after eating and has been worsening at night, particularly when lying down. He wakes up with a burning throat sensation, affecting his sleep. He eats about three to four hours before sleeping. He also experiences increased belching and a sensation of something stuck in his throat, described as a burning sensation rather than nausea. He reports a bitter taste in the back of his mouth in the mornings. No coughing at night, abdominal pain, diarrhea, or constipation. He sometimes feels bloated and gassy after eating. He has tried Prilosec without relief.  He reports a recent onset of ear symptoms, starting about a week and a half ago, with a clear discharge from both ears, more pronounced in the left ear. He experiences a loud ringing sensation, particularly in the left ear, and sometimes wakes up with a clogged sensation and diminished hearing in the left ear. No ear pain, fever, or chills. He notes the unusual nature of the clear discharge. He cleans his ears with Q-tips every other night after showering. He has not been swimming in the past three to four weeks.   Review of Systems  All other systems reviewed and are negative.      No Known Allergies  Current Outpatient Medications on File Prior to Visit  Medication Sig Dispense Refill    ALPRAZolam  (XANAX ) 0.25 MG tablet Take 1 tablet (0.25 mg total) by mouth 2 (two) times daily as needed for anxiety. 30 tablet 0   Multiple Vitamins-Minerals (AIRBORNE PO) Take by mouth daily.     No current facility-administered medications on file prior to visit.    BP 124/86 (BP Location: Right Arm, Patient Position: Sitting, Cuff Size: Normal)   Pulse 98   Temp 99 F (37.2 C) (Oral)   Ht 5' 8 (1.727 m)   Wt 165 lb (74.8 kg)   SpO2 98%   BMI 25.09 kg/m   Objective:      Physical Exam GENERAL: Alert, cooperative, well developed, no acute distress. HEAD: Normocephalic atraumatic. EYES: Extraocular movements intact BL, pupils round, equal and reactive to light BL, conjunctivae normal BL. LEFT EAR: Tympanic membrane ruptured on the left, whitish discharge with foul odor present in the ear canal, no tenderness on manipulation of the tragus, pinna or helix. RIGHT EAR: Hardened cerumen present, not completely impacted EXTREMITIES: No cyanosis or edema. NEUROLOGICAL: Oriented to person, place and time, no gait abnormalities, moves all extremities without gross motor or sensory deficit.       Assessment & Plan:   Assessment & Plan Gastroesophageal reflux disease without esophagitis Will trial with 8 weeks of PPI. Previous Prilosec trial ineffective.  - Prescribed omeprazole 40 mg daily in the morning for 60 days. - Advised dietary modifications, reducing tomato-based foods, considering white sauce for pasta. - Provided dietary recommendations printout to avoid reflux-triggering foods. -  Advised Tums as needed for breakthrough symptoms.  Acute suppurative otitis media with spontaneous rupture of ear drum, left ear Left TM perforation with whitish foul-smelling discharge in the ear canal, suggests infection.  Will treat with antibiotics as below with plan to reassess for resolution in the coming days. Reassured patient that TM perforations are self healing over time.  -  Prescribed Augmentin twice daily for 7 days. - Advised follow-up in 10 days to reassess infection resolution. - Discussed Augmentin side effects: stomach discomfort, bloating, diarrhea. - Advised taking Augmentin with food to minimize GI side effects.    Return in about 10 days (around 02/29/2024) for Ear infection with PCP.   Gaynell Eggleton K Jahzeel Poythress, MD  02/19/24     Contains text generated by Abridge.

## 2024-03-04 ENCOUNTER — Ambulatory Visit: Payer: Self-pay | Admitting: Nurse Practitioner

## 2024-03-04 VITALS — BP 122/82 | HR 72 | Temp 97.6°F | Ht 68.0 in | Wt 169.6 lb

## 2024-03-04 DIAGNOSIS — H6500 Acute serous otitis media, unspecified ear: Secondary | ICD-10-CM

## 2024-03-04 DIAGNOSIS — K219 Gastro-esophageal reflux disease without esophagitis: Secondary | ICD-10-CM

## 2024-03-04 MED ORDER — OMEPRAZOLE 20 MG PO CPDR: 20.0000 mg | DELAYED_RELEASE_CAPSULE | Freq: Every day | ORAL | 0 refills | Status: AC

## 2024-03-04 MED ORDER — LEVOFLOXACIN 500 MG PO TABS: 500.0000 mg | ORAL_TABLET | Freq: Every day | ORAL | 0 refills | Status: AC

## 2024-03-04 NOTE — Progress Notes (Signed)
 Established Patient Office Visit  Subjective   Patient ID: Brent Faulkner, male    DOB: 18-Jul-1990  Age: 33 y.o. MRN: [id number]  Chief Complaint  Patient presents with   Follow-up    Pt complains of doing better. States that he is still having slight ringing noises, but no drainage or clogging anymore.     HPI   Discussed the use of AI scribe software for clinical note transcription with the patient, who gave verbal consent to proceed.  History of Present Illness Brent Faulkner is a 33 year old male who presents with ear symptoms and heartburn.  His ear symptoms have improved since completing a course of antibiotics. Initially, he experienced a malodorous discharge and a sensation of fullness in the ear, described as feeling like 'there's a Q tip in my ear.' These symptoms have resolved, but he continues to experience mild tinnitus in the left ear, which does not affect his hearing. No blood discharge, and the fullness has resolved. No fever, chills, or other systemic symptoms.  Regarding heartburn, he has experienced significant improvement since starting omeprazole  40 mg daily. Prior to treatment, he experienced severe heartburn with a burning sensation in his chest and throat, particularly at night, and a 'metallic' taste. Symptoms were exacerbated by lying down after meals. He has adjusted his eating schedule to have his last meal several hours before bedtime. He denies any specific trigger foods, although he avoids spicy foods due to sweating. He has not had to change his diet significantly since starting omeprazole . He noticed a significant improvement in symptoms within two to three days of starting the medication.  No fever, chills, cough, chest pain, or shortness of breath. He is currently uninsured.    Review of Systems  Constitutional:  Negative for chills and fever.  HENT:  Positive for tinnitus. Negative for ear discharge, ear pain and hearing loss.   Respiratory:   Negative for shortness of breath.   Cardiovascular:  Negative for chest pain.  Gastrointestinal:  Negative for abdominal pain.  Neurological:  Negative for headaches.  Psychiatric/Behavioral:  Negative for hallucinations and suicidal ideas.       Objective:     BP 122/82   Pulse 72   Temp 97.6 F (36.4 C) (Oral)   Ht 5' 8 (1.727 m)   Wt 169 lb 9.6 oz (76.9 kg)   SpO2 99%   BMI 25.79 kg/m    Physical Exam Vitals and nursing note reviewed.  Constitutional:      Appearance: Normal appearance.  HENT:     Right Ear: Ear canal and external ear normal. Drainage present. No tenderness.     Left Ear: Tympanic membrane, ear canal and external ear normal.     Ears:     Comments: Unable to visualize if TM is ruptured. Foul odor White/mucoid drainage     Mouth/Throat:     Mouth: Mucous membranes are moist.     Pharynx: Oropharynx is clear.  Cardiovascular:     Rate and Rhythm: Normal rate and regular rhythm.     Heart sounds: Normal heart sounds.  Pulmonary:     Effort: Pulmonary effort is normal.     Breath sounds: Normal breath sounds.  Abdominal:     General: Bowel sounds are normal. There is no distension.     Palpations: There is no mass.     Tenderness: There is no abdominal tenderness.     Hernia: No hernia is present.  Neurological:  Mental Status: He is alert.      No results found for any visits on 03/04/24.    The ASCVD Risk score (Arnett DK, et al., 2019) failed to calculate for the following reasons:   The 2019 ASCVD risk score is only valid for ages 70 to 35    Assessment & Plan:   Problem List Items Addressed This Visit   None Visit Diagnoses       Acute serous otitis media, recurrence not specified, unspecified laterality    -  Primary   Relevant Medications   levofloxacin (LEVAQUIN) 500 MG tablet     Gastroesophageal reflux disease, unspecified whether esophagitis present       Relevant Medications   omeprazole  (PRILOSEC) 20 MG capsule       Assessment and Plan Assessment & Plan Left ear infection with residual tinnitus Completed antibiotics for ear infection. Discharge and fullness resolved, mild tinnitus persists. No significant hearing impairment or ototoxic medication use. - Examined eardrum for complications. - Consider ENT referral if tinnitus persists. - Eardrum still infected with discharge and foul odor.  Advised not to submerge head underwater and will place patient on Levaquin 500 milligrams daily for 7 days.  Follow-up in office in 10 days  Gastroesophageal reflux disease (GERD) GERD symptoms improved with omeprazole  40 mg daily. Discussed rebound reflux risk if medication stopped abruptly. - Continue omeprazole  40 mg daily for 60 days. - Prescribe omeprazole  20 mg daily for 30 days after initial period. - Monitor symptoms and adjust treatment if necessary. - Consider further evaluation if symptoms recur, including probiotic use, H. pylori testing, or GI referral.   Return in about 10 days (around 03/14/2024) for Otitis ecterna with me.    Brent Crandall, NP

## 2024-03-04 NOTE — Patient Instructions (Signed)
 Nice to see you today  I have sent in 2 medications I need to see you in approx 10 days, sooner if you need me

## 2024-03-14 ENCOUNTER — Ambulatory Visit: Payer: Self-pay | Admitting: Family Medicine

## 2024-03-14 ENCOUNTER — Telehealth: Payer: Self-pay | Admitting: Nurse Practitioner

## 2024-03-14 ENCOUNTER — Ambulatory Visit: Payer: Self-pay | Admitting: Nurse Practitioner

## 2024-03-14 VITALS — BP 130/70 | HR 78 | Temp 97.6°F | Ht 68.0 in | Wt 170.4 lb

## 2024-03-14 DIAGNOSIS — H66012 Acute suppurative otitis media with spontaneous rupture of ear drum, left ear: Secondary | ICD-10-CM

## 2024-03-14 NOTE — Patient Instructions (Addendum)
 Nice to see you today  I have referred you to ear, nose, and throat specialist. If you have not heard from them by next week let me know Follow up as needed

## 2024-03-14 NOTE — Progress Notes (Signed)
 Established Patient Office Visit  Subjective   Patient ID: Brent Faulkner, male    DOB: 10/03/1990  Age: 33 y.o. MRN: [id number]  Chief Complaint  Patient presents with   Follow-up    Pt complains of feeling better. States of no clogging, pain or infection after taking round of antibiotics. (Finished Monday)     HPI  Discussed the use of AI scribe software for clinical note transcription with the patient, who gave verbal consent to proceed.  History of Present Illness Brent Faulkner is a 33 year old male who presents with persistent ear symptoms following treatment for an ear infection.  He was treated for an ear infection approximately one month ago and was initially prescribed an antibiotic. He was subsequently placed on Levaquin  (levofloxacin ), which he completed on Monday. He experienced mild diarrhea on the first day of treatment, which resolved within 24 hours.  Currently, he has persistent ear symptoms, including a mild ringing in the ear, though it is not as severe as before. No discharge or clogging of the ear is noted. He also has no fever, chills, sore throat, chest pain, or shortness of breath. His hearing remains intact in the affected ear.  He does not have a preference for the location of further specialist consultation, as he works in [place] and lives in [place].    Review of Systems  Constitutional:  Negative for chills and fever.  HENT:  Positive for tinnitus. Negative for ear discharge, ear pain and hearing loss.   Respiratory:  Negative for shortness of breath.   Cardiovascular:  Negative for chest pain.      Objective:     BP 130/70   Pulse 78   Temp 97.6 F (36.4 C) (Oral)   Ht 5' 8 (1.727 m)   Wt 170 lb 6.4 oz (77.3 kg)   SpO2 99%   BMI 25.91 kg/m    Physical Exam Vitals and nursing note reviewed.  Constitutional:      Appearance: Normal appearance.  HENT:     Right Ear: Tympanic membrane, ear canal and external ear normal.      Left Ear: Ear canal and external ear normal.     Ears:     Comments: Purulent discharge opaque fluid and erythema to TM. Slight odor  Cardiovascular:     Rate and Rhythm: Normal rate and regular rhythm.     Heart sounds: Normal heart sounds.  Pulmonary:     Effort: Pulmonary effort is normal.     Breath sounds: Normal breath sounds.  Neurological:     Mental Status: He is alert.      No results found for any visits on 03/14/24.    The ASCVD Risk score (Arnett DK, et al., 2019) failed to calculate for the following reasons:   The 2019 ASCVD risk score is only valid for ages 64 to 61    Assessment & Plan:   Problem List Items Addressed This Visit   None Visit Diagnoses       Non-recurrent acute suppurative otitis media of left ear with spontaneous rupture of tympanic membrane    -  Primary   Relevant Orders   Ambulatory referral to ENT      Assessment and Plan Assessment & Plan Acute serous otitis media Persistent symptoms despite Augmentin  and Levaquin . ENT evaluation needed. - Referred to ENT for further evaluation and management. - Advised against submerging head in water; showering is permissible. - Instructed to report any fever, chills, or  hearing loss.   Return if symptoms worsen or fail to improve.    Adina Crandall, NP

## 2024-03-14 NOTE — Telephone Encounter (Signed)
 Made an urgent ENT referral for the patient in [place]. If there is a long wait we can try [place]

## 2024-03-15 ENCOUNTER — Encounter (INDEPENDENT_AMBULATORY_CARE_PROVIDER_SITE_OTHER): Payer: Self-pay

## 2024-04-10 ENCOUNTER — Encounter (INDEPENDENT_AMBULATORY_CARE_PROVIDER_SITE_OTHER): Payer: Self-pay | Admitting: Physician Assistant

## 2024-04-10 ENCOUNTER — Ambulatory Visit (INDEPENDENT_AMBULATORY_CARE_PROVIDER_SITE_OTHER): Payer: Self-pay | Admitting: Physician Assistant

## 2024-04-10 VITALS — BP 129/81 | HR 92 | Temp 98.0°F | Ht 69.0 in | Wt 165.0 lb

## 2024-04-10 DIAGNOSIS — H60502 Unspecified acute noninfective otitis externa, left ear: Secondary | ICD-10-CM

## 2024-04-10 NOTE — Progress Notes (Signed)
 Patient explains that he has white coat syndrome.

## 2024-04-11 ENCOUNTER — Encounter (INDEPENDENT_AMBULATORY_CARE_PROVIDER_SITE_OTHER): Payer: Self-pay

## 2024-04-11 ENCOUNTER — Telehealth (INDEPENDENT_AMBULATORY_CARE_PROVIDER_SITE_OTHER): Payer: Self-pay

## 2024-04-11 MED ORDER — PREDNISOLONE ACETATE 1 % OP SUSP: OPHTHALMIC | 0 refills | Status: AC

## 2024-04-11 MED ORDER — CIPROFLOXACIN HCL 0.3 % OP SOLN: OPHTHALMIC | 0 refills | Status: AC

## 2024-04-11 NOTE — Addendum Note (Signed)
 Addended byBETHA COHEN, Phyllicia Dudek on: 04/11/2024 11:57 AM   Modules accepted: Orders

## 2024-04-11 NOTE — Progress Notes (Signed)
 Dear Dr. Wendee, Here is my assessment for our mutual patient, Brent Faulkner. Thank you for allowing me the opportunity to care for your patient. Please do not hesitate to contact me should you have any other questions. Sincerely, Chyrl Cohen PA-C  Otolaryngology Clinic Note Referring provider: Dr. Wendee HPI:  Brent Faulkner is a 34 y.o. male kindly referred by Dr. Wendee   Discussed the use of AI scribe software for clinical note transcription with the patient, who gave verbal consent to proceed.  History of Present Illness   Brent Faulkner is a 33 year old male with a history of recurrent childhood otitis media and tympanostomy tube placement who presents with persistent left ear discharge.  Since early December, he has experienced persistent clear, malodorous otorrhea from the left ear, most pronounced upon waking and after lying on the affected side. He reports intermittent aural fullness and mild tinnitus in the left ear, but denies otalgia. He notes no significant hearing loss aside from the mild tinnitus associated with the current episode.  He completed one-week courses of oral amoxicillin  and oral levofloxacin  without improvement. He has not used topical otic drops. Previous evaluations raised concern for tympanic membrane perforation, though subsequent assessment was inconclusive.  He denies recent swimming, with last exposure during the summer, and is unaware of any prior external auditory canal infections. He describes mild nasal congestion and sensation of mucus during the episode, but denies upper respiratory pain.  He has a history of frequent otitis media and tympanostomy tube placement in childhood, with resolution of symptoms after adolescence and no ear infections for many years prior to this episode.           Independent Review of Additional Tests or Records:  Progress note 03/14/2024-ear drainage   PMH/Meds/All/SocHx/FamHx/ROS:   Past Medical History:   Diagnosis Date   Anxiety    Depression    History of multiple concussions      Past Surgical History:  Procedure Laterality Date   DENTAL SURGERY     NO PAST SURGERIES      Family History  Problem Relation Age of Onset   Addison's disease Mother    Healthy Father    Healthy Sister    Diabetes Brother        Type 1   Breast cancer Maternal Grandmother    Heart attack Maternal Grandfather 1   Lung cancer Paternal Grandfather        tobacco use     Social Connections: Moderately Integrated (02/18/2024)   Social Connection and Isolation Panel    Frequency of Communication with Friends and Family: More than three times a week    Frequency of Social Gatherings with Friends and Family: Once a week    Attends Religious Services: 1 to 4 times per year    Active Member of [employer] or Organizations: Yes    Attends Engineer, Structural: More than 4 times per year    Marital Status: Never married     Current Medications[1]   Physical Exam:   BP 129/81   Pulse 92   Temp 98 F (36.7 C)   Ht 5' 9 (1.753 m)   Wt 165 lb (74.8 kg)   SpO2 98%   BMI 24.37 kg/m   Pertinent Findings  CN II-XII grossly intact EAC clear, TM intact well-pneumatized middle ear space, left EAC with minor erythema and purulence, TM intact with some granular tissue noted, no erythema over the TM, no obvious effusion Anterior rhinoscopy:  Septum midline; bilateral inferior turbinates with no hypertrophy No lesions of oral cavity/oropharynx; dentition wnl No obviously palpable neck masses/lymphadenopathy/thyromegaly No respiratory distress or stridor      Seprately Identifiable Procedures:  None  Impression & Plans:  Brent Faulkner is a 34 y.o. male with the following   Assessment and Plan    Acute otitis externa, left ear Persistent left otorrhea with malodor, fullness, and mild tinnitus. Likely canal infection with reactive tissue. Oral antibiotics ineffective; topical therapy  anticipated to resolve condition.  - Prescribed ciprofloxacin /dexamethasone otic drops for two weeks. - Advised to notify if Ciprodex is cost-prohibitive to consider alternative topical agents. - Instructed to return in two weeks for reassessment of therapeutic response. - Advised to return sooner for worsening symptoms, including increased otalgia, hearing changes, or swelling. - Coordinated follow-up scheduling with front desk.           - f/u 2 weeks   Thank you for allowing me the opportunity to care for your patient. Please do not hesitate to contact me should you have any other questions.  Sincerely, Chyrl Cohen PA-C [facility] Phone: [phone] Fax: [phone]  04/11/2024, 11:52 AM         [1]  Current Outpatient Medications:    ALPRAZolam  (XANAX ) 0.25 MG tablet, Take 1 tablet (0.25 mg total) by mouth 2 (two) times daily as needed for anxiety., Disp: 30 tablet, Rfl: 0   Multiple Vitamins-Minerals (AIRBORNE PO), Take by mouth daily., Disp: , Rfl:    omeprazole  (PRILOSEC) 20 MG capsule, Take 1 capsule (20 mg total) by mouth daily. After you finish the omeprazole  40 mg script, Disp: 30 capsule, Rfl: 0   omeprazole  (PRILOSEC) 40 MG capsule, Take 1 capsule (40 mg total) by mouth daily., Disp: 60 capsule, Rfl: 0

## 2024-04-11 NOTE — Telephone Encounter (Signed)
 Patient called call answered. Patient stated that he went by the pharmacy at 2:45 Pm and they did not have the prescription in. Please advise.

## 2024-04-11 NOTE — Telephone Encounter (Signed)
 I sent them to the [facility] in [place] [facility] [place], can you confirm that is where he would like them, it was the pharmacy listed

## 2024-04-25 ENCOUNTER — Encounter (INDEPENDENT_AMBULATORY_CARE_PROVIDER_SITE_OTHER): Payer: Self-pay | Admitting: Physician Assistant

## 2024-04-25 ENCOUNTER — Ambulatory Visit (INDEPENDENT_AMBULATORY_CARE_PROVIDER_SITE_OTHER): Payer: Self-pay | Admitting: Physician Assistant

## 2024-04-25 VITALS — BP 138/70 | HR 86 | Temp 97.6°F

## 2024-04-25 DIAGNOSIS — H60502 Unspecified acute noninfective otitis externa, left ear: Secondary | ICD-10-CM

## 2024-04-25 DIAGNOSIS — Z8669 Personal history of other diseases of the nervous system and sense organs: Secondary | ICD-10-CM

## 2024-04-25 DIAGNOSIS — Z09 Encounter for follow-up examination after completed treatment for conditions other than malignant neoplasm: Secondary | ICD-10-CM

## 2024-04-26 NOTE — Progress Notes (Signed)
 Dear Dr. Wendee, Here is my assessment for our mutual patient, Brent Faulkner. Thank you for allowing me the opportunity to care for your patient. Please do not hesitate to contact me should you have any other questions. Sincerely, Chyrl Cohen PA-C  Otolaryngology Clinic Note Referring provider: Dr. Wendee HPI:  Brent Faulkner is a 34 y.o. male kindly referred by Dr. Wendee   34 year old gentleman seen in our office for follow-up evaluation of otitis externa.  He was last seen in the office on 04/10/2024.  Below is recap of that encounter.  Brent Faulkner is a 34 year old male with a history of recurrent childhood otitis media and tympanostomy tube placement who presents with persistent left ear discharge.   Since early December, he has experienced persistent clear, malodorous otorrhea from the left ear, most pronounced upon waking and after lying on the affected side. He reports intermittent aural fullness and mild tinnitus in the left ear, but denies otalgia. He notes no significant hearing loss aside from the mild tinnitus associated with the current episode.   He completed one-week courses of oral amoxicillin  and oral levofloxacin  without improvement. He has not used topical otic drops. Previous evaluations raised concern for tympanic membrane perforation, though subsequent assessment was inconclusive.   He denies recent swimming, with last exposure during the summer, and is unaware of any prior external auditory canal infections. He describes mild nasal congestion and sensation of mucus during the episode, but denies upper respiratory pain.   He has a history of frequent otitis media and tympanostomy tube placement in childhood, with resolution of symptoms after adolescence and no ear infections for many years prior to this episode.   Update 04/25/2004.  Since his last office visit he notes that he has been doing well, denies any further drainage from his ear, no changes to his hearing, no  pain.  He feels like he is back to normal.  He did use the otic drops as indicated.   Independent Review of Additional Tests or Records:  None   PMH/Meds/All/SocHx/FamHx/ROS:   Past Medical History:  Diagnosis Date   Anxiety    Depression    History of multiple concussions      Past Surgical History:  Procedure Laterality Date   DENTAL SURGERY     NO PAST SURGERIES      Family History  Problem Relation Age of Onset   Addison's disease Mother    Healthy Father    Healthy Sister    Diabetes Brother        Type 1   Breast cancer Maternal Grandmother    Heart attack Maternal Grandfather 90   Lung cancer Paternal Grandfather        tobacco use     Social Connections: Moderately Integrated (02/18/2024)   Social Connection and Isolation Panel    Frequency of Communication with Friends and Family: More than three times a week    Frequency of Social Gatherings with Friends and Family: Once a week    Attends Religious Services: 1 to 4 times per year    Active Member of [employer] or Organizations: Yes    Attends Engineer, Structural: More than 4 times per year    Marital Status: Never married     Current Medications[1]   Physical Exam:   BP 138/70   Pulse 86   Temp 97.6 F (36.4 C)   SpO2 98%   Pertinent Findings  CN II-XII grossly intact Bilateral EAC clear and TM  intact with well pneumatized middle ear spaces, left ear with some scarring of the TM, sclerosis on the right TM No obviously palpable neck masses/lymphadenopathy/thyromegaly No respiratory distress or stridor    Seprately Identifiable Procedures:  None  Impression & Plans:  Brent Faulkner is a 34 y.o. male with the following    Otitis externa-  Resolved without issue.  Patient feels back to baseline with no hearing related issues.  If symptoms recur he will reach out to the office.  He verbalized understanding and agreement to today's plan.  - f/u PRN   Thank you for allowing me the  opportunity to care for your patient. Please do not hesitate to contact me should you have any other questions.  Sincerely, Chyrl Cohen PA-C [facility] Phone: [phone] Fax: [phone]  04/26/2024, 8:54 AM         [1]  Current Outpatient Medications:    ALPRAZolam  (XANAX ) 0.25 MG tablet, Take 1 tablet (0.25 mg total) by mouth 2 (two) times daily as needed for anxiety., Disp: 30 tablet, Rfl: 0   ciprofloxacin  (CILOXAN ) 0.3 % ophthalmic solution, 4 drops left  ear BID x 14 days, Disp: 5 mL, Rfl: 0   Multiple Vitamins-Minerals (AIRBORNE PO), Take by mouth daily., Disp: , Rfl:    omeprazole  (PRILOSEC) 20 MG capsule, Take 1 capsule (20 mg total) by mouth daily. After you finish the omeprazole  40 mg script, Disp: 30 capsule, Rfl: 0   omeprazole  (PRILOSEC) 40 MG capsule, Take 1 capsule (40 mg total) by mouth daily., Disp: 60 capsule, Rfl: 0   prednisoLONE  acetate (PRED FORTE ) 1 % ophthalmic suspension, 4 drops left ear BID for 14 days, Disp: 5 mL, Rfl: 0
